# Patient Record
Sex: Female | Born: 1990 | Hispanic: No | Marital: Single | Smoking: Never smoker
Health system: Southern US, Community
[De-identification: ages and names within clinical notes are randomized; demographics above are authoritative.]

## PROBLEM LIST (undated history)

## (undated) ENCOUNTER — Emergency Department (HOSPITAL_COMMUNITY): Admission: EM | Payer: Self-pay | Source: Home / Self Care

## (undated) DIAGNOSIS — R87629 Unspecified abnormal cytological findings in specimens from vagina: Secondary | ICD-10-CM

## (undated) DIAGNOSIS — E119 Type 2 diabetes mellitus without complications: Secondary | ICD-10-CM

## (undated) DIAGNOSIS — O139 Gestational [pregnancy-induced] hypertension without significant proteinuria, unspecified trimester: Secondary | ICD-10-CM

## (undated) HISTORY — PX: BREAST SURGERY: SHX581

---

## 2013-07-14 DIAGNOSIS — O139 Gestational [pregnancy-induced] hypertension without significant proteinuria, unspecified trimester: Secondary | ICD-10-CM

## 2017-01-19 LAB — CYTOLOGY - PAP: PAP SMEAR: ABNORMAL — AB

## 2017-03-06 NOTE — L&D Delivery Note (Signed)
OB/GYN Faculty Practice Delivery Note  [name] is a 27 y.o. G2P1001 s/p SVD at 1540w0d. She was admitted for IOL for A1GDM.   ROM: 1h 2459m with clear fluid GBS Status: negative Maximum Maternal Temperature: Temp (24hrs), Avg:98.1 F (36.7 C), Min:98 F (36.7 C), Max:98.2 F (36.8 C)  Labor Progress: . Induction with cytotec and FB . Transitioned to pitocin . SROM clear fluid and near complete . Pushing involuntarily when 9cm dilated, no analgesia   Delivery Date/Time: 01/23/18 at 2006 Delivery: Called to room and patient was complete and pushing. Head delivered ROA with compound presentation of hand. No nuchal cord present - loose body cord. Shoulder and body delivered in usual fashion. Infant with spontaneous cry, placed on mother's abdomen, dried and stimulated. Cord clamped x 2 after 1-minute delay, and cut by provider. Cord blood drawn. Placenta delivered spontaneously with gentle cord traction. Fundus firm with massage and Pitocin. Labia, perineum, vagina, and cervix inspected inspected with 1st degree laceration and some cervical friability but without active bleeding.   Placenta: spontaneous, intact, 3-vessel cord Complications: pushing with 9cm cervix, occasional decelerations after AROM with pushing Lacerations: 1st degree repaired with 3-0 Vicryl and cervical friability without active bleeding EBL: 575cc  Postpartum Planning [x]  message to sent to schedule follow-up  [x]  vaccines UTD  Infant: vigorous female  APGARs 9, 9  weight pending  Arth Nicastro S. Earlene PlaterWallace, DO OB/GYN Fellow, Faculty Practice

## 2017-07-19 LAB — OB RESULTS CONSOLE RUBELLA ANTIBODY, IGM: Rubella: NON-IMMUNE/NOT IMMUNE

## 2017-07-19 LAB — SICKLE CELL SCREEN: Sickle Cell Screen: NEGATIVE

## 2017-07-19 LAB — OB RESULTS CONSOLE HIV ANTIBODY (ROUTINE TESTING): HIV: NONREACTIVE

## 2017-07-19 LAB — OB RESULTS CONSOLE ABO/RH: RH Type: POSITIVE

## 2017-07-19 LAB — OB RESULTS CONSOLE HGB/HCT, BLOOD
HEMATOCRIT: 36
Hemoglobin: 12.2

## 2017-07-19 LAB — OB RESULTS CONSOLE HEPATITIS B SURFACE ANTIGEN: Hepatitis B Surface Ag: NEGATIVE

## 2017-07-19 LAB — OB RESULTS CONSOLE RPR: RPR: NONREACTIVE

## 2017-07-19 LAB — OB RESULTS CONSOLE PLATELET COUNT: PLATELETS: 200

## 2017-07-19 LAB — CYSTIC FIBROSIS DIAGNOSTIC STUDY: INTERPRETATION-CFDNA: NEGATIVE

## 2017-07-19 LAB — OB RESULTS CONSOLE VARICELLA ZOSTER ANTIBODY, IGG: Varicella: IMMUNE

## 2017-07-19 LAB — OB RESULTS CONSOLE GC/CHLAMYDIA
CHLAMYDIA, DNA PROBE: NEGATIVE
GC PROBE AMP, GENITAL: NEGATIVE

## 2017-07-19 LAB — OB RESULTS CONSOLE ANTIBODY SCREEN: Antibody Screen: NEGATIVE

## 2017-08-07 ENCOUNTER — Encounter: Payer: Self-pay | Admitting: *Deleted

## 2017-08-09 ENCOUNTER — Ambulatory Visit (INDEPENDENT_AMBULATORY_CARE_PROVIDER_SITE_OTHER): Payer: Medicaid Other | Admitting: Obstetrics and Gynecology

## 2017-08-09 ENCOUNTER — Encounter: Payer: Self-pay | Admitting: Obstetrics and Gynecology

## 2017-08-09 DIAGNOSIS — O0992 Supervision of high risk pregnancy, unspecified, second trimester: Secondary | ICD-10-CM

## 2017-08-09 DIAGNOSIS — Z87898 Personal history of other specified conditions: Secondary | ICD-10-CM | POA: Insufficient documentation

## 2017-08-09 DIAGNOSIS — O0990 Supervision of high risk pregnancy, unspecified, unspecified trimester: Secondary | ICD-10-CM

## 2017-08-09 DIAGNOSIS — Z8759 Personal history of other complications of pregnancy, childbirth and the puerperium: Secondary | ICD-10-CM

## 2017-08-09 LAB — POCT URINALYSIS DIP (DEVICE)
Glucose, UA: NEGATIVE mg/dL
Hgb urine dipstick: NEGATIVE
Nitrite: NEGATIVE
PROTEIN: 30 mg/dL — AB
SPECIFIC GRAVITY, URINE: 1.025 (ref 1.005–1.030)
UROBILINOGEN UA: 0.2 mg/dL (ref 0.0–1.0)
pH: 5.5 (ref 5.0–8.0)

## 2017-08-09 LAB — GLUCOSE TOLERANCE, 1 HOUR: GLUCOSE 1 HOUR GTT: 85

## 2017-08-09 MED ORDER — ASPIRIN EC 81 MG PO TBEC: 81.0000 mg | DELAYED_RELEASE_TABLET | Freq: Every day | ORAL | 2 refills | Status: DC

## 2017-08-09 NOTE — Addendum Note (Signed)
Addended by: Kathee DeltonHILLMAN, Shedrick Sarli L on: 08/09/2017 02:36 PM   Modules accepted: Orders

## 2017-08-09 NOTE — Progress Notes (Signed)
Subjective:  Kerry Turner is a 27 y.o. G1P0 at 5972w1d being seen today for ongoing prenatal care. [facility] with IOL at 38 weeks. Also LBW of 5 # 7 oz.   She is currently monitored for the following issues for this high-risk pregnancy and has Supervision of high risk pregnancy, antepartum; History of low birth weight; and History of gestational hypertension on their problem list.  Patient reports headache.  Contractions: Not present. Vag. Bleeding: None.  Movement: Absent. Denies leaking of fluid.   The following portions of the patient's history were reviewed and updated as appropriate: allergies, current medications, past family history, past medical history, past social history, past surgical history and problem list. Problem list updated.  Objective:   Vitals:   08/09/17 1339 08/09/17 1339  BP: 107/63   Pulse: 78   Weight: 129 lb (58.5 kg)   Height:  4\' 11"  (1.499 m)    Fetal Status: Fetal Heart Rate (bpm): 154   Movement: Absent     General:  Alert, oriented and cooperative. Patient is in no acute distress.  Skin: Skin is warm and dry. No rash noted.   Cardiovascular: Normal heart rate noted  Respiratory: Normal respiratory effort, no problems with respiration noted  Abdomen: Soft, gravid, appropriate for gestational age. Pain/Pressure: Present     Pelvic:  Cervical exam deferred        Extremities: Normal range of motion.  Edema: None  Mental Status: Normal mood and affect. Normal behavior. Normal judgment and thought content.   Urinalysis: Urine Protein: 1+ Urine Glucose: Negative  Assessment and Plan:  Pregnancy: G1P0 at 6672w1d  1. Supervision of high risk pregnancy, antepartum Prenatal care reviewed with pt - US MFM OB DETAIL +14 WK; Future  2. History of low birth weight Appears to be related to # 3 - US MFM OB DETAIL +14 WK; Future  3. History of gestational hypertension Will have pt sign for release of medical records of  2105 delivery Discussed PEC and pregnancy Will start BASA. Discussed indications for BASA Will check additional labs today as well.  Interrupter services used for today's visit  Preterm labor symptoms and general obstetric precautions including but not limited to vaginal bleeding, contractions, leaking of fluid and fetal movement were reviewed in detail with the patient. Please refer to After Visit Summary for other counseling recommendations.  Return in about 1 month (around 09/06/2017) for OB visit.   Hermina StaggersErvin, Michael L, MD

## 2017-08-09 NOTE — Patient Instructions (Signed)

## 2017-08-10 LAB — COMPREHENSIVE METABOLIC PANEL
A/G RATIO: 1.3 (ref 1.2–2.2)
ALBUMIN: 3.6 g/dL (ref 3.5–5.5)
ALK PHOS: 60 IU/L (ref 39–117)
ALT: 13 IU/L (ref 0–32)
AST: 12 IU/L (ref 0–40)
BILIRUBIN TOTAL: 0.3 mg/dL (ref 0.0–1.2)
BUN / CREAT RATIO: 13 (ref 9–23)
BUN: 4 mg/dL — AB (ref 6–20)
CHLORIDE: 106 mmol/L (ref 96–106)
CO2: 21 mmol/L (ref 20–29)
Calcium: 9.3 mg/dL (ref 8.7–10.2)
Creatinine, Ser: 0.31 mg/dL — ABNORMAL LOW (ref 0.57–1.00)
GFR calc non Af Amer: 156 mL/min/{1.73_m2} (ref >59)
GFR, EST AFRICAN AMERICAN: 180 mL/min/{1.73_m2} (ref >59)
GLUCOSE: 93 mg/dL (ref 65–99)
Globulin, Total: 2.7 g/dL (ref 1.5–4.5)
POTASSIUM: 3.6 mmol/L (ref 3.5–5.2)
Sodium: 139 mmol/L (ref 134–144)
TOTAL PROTEIN: 6.3 g/dL (ref 6.0–8.5)

## 2017-08-10 LAB — PROTEIN / CREATININE RATIO, URINE
CREATININE, UR: 178.3 mg/dL
Protein, Ur: 35.8 mg/dL
Protein/Creat Ratio: 201 mg/g creat — ABNORMAL HIGH (ref 0–200)

## 2017-08-10 LAB — TSH: TSH: 0.685 u[IU]/mL (ref 0.450–4.500)

## 2017-08-16 ENCOUNTER — Encounter: Payer: Self-pay | Admitting: *Deleted

## 2017-08-21 ENCOUNTER — Encounter (HOSPITAL_COMMUNITY): Payer: Self-pay

## 2017-08-29 ENCOUNTER — Encounter (HOSPITAL_COMMUNITY): Payer: Self-pay

## 2017-08-29 ENCOUNTER — Other Ambulatory Visit: Payer: Self-pay | Admitting: Obstetrics and Gynecology

## 2017-08-29 ENCOUNTER — Ambulatory Visit (HOSPITAL_COMMUNITY)
Admission: RE | Admit: 2017-08-29 | Discharge: 2017-08-29 | Disposition: A | Discharge status: Home / Self Care | Payer: Medicaid Other | Source: Ambulatory Visit | Attending: Obstetrics and Gynecology | Admitting: Obstetrics and Gynecology

## 2017-08-29 DIAGNOSIS — Z3A19 19 weeks gestation of pregnancy: Secondary | ICD-10-CM

## 2017-08-29 DIAGNOSIS — O321XX0 Maternal care for breech presentation, not applicable or unspecified: Secondary | ICD-10-CM | POA: Insufficient documentation

## 2017-08-29 DIAGNOSIS — O09292 Supervision of pregnancy with other poor reproductive or obstetric history, second trimester: Secondary | ICD-10-CM | POA: Insufficient documentation

## 2017-08-29 DIAGNOSIS — O0990 Supervision of high risk pregnancy, unspecified, unspecified trimester: Secondary | ICD-10-CM

## 2017-08-29 DIAGNOSIS — Z87898 Personal history of other specified conditions: Secondary | ICD-10-CM

## 2017-08-29 DIAGNOSIS — Z363 Encounter for antenatal screening for malformations: Secondary | ICD-10-CM

## 2017-08-29 DIAGNOSIS — Z8759 Personal history of other complications of pregnancy, childbirth and the puerperium: Secondary | ICD-10-CM

## 2017-08-29 DIAGNOSIS — O09299 Supervision of pregnancy with other poor reproductive or obstetric history, unspecified trimester: Secondary | ICD-10-CM

## 2017-08-29 HISTORY — DX: Gestational (pregnancy-induced) hypertension without significant proteinuria, unspecified trimester: O13.9

## 2017-08-30 ENCOUNTER — Other Ambulatory Visit (HOSPITAL_COMMUNITY): Payer: Self-pay | Admitting: *Deleted

## 2017-08-30 DIAGNOSIS — O09299 Supervision of pregnancy with other poor reproductive or obstetric history, unspecified trimester: Secondary | ICD-10-CM

## 2017-09-07 ENCOUNTER — Encounter: Payer: Medicaid Other | Admitting: Obstetrics & Gynecology

## 2017-09-07 ENCOUNTER — Telehealth: Payer: Self-pay | Admitting: Obstetrics & Gynecology

## 2017-09-07 NOTE — Telephone Encounter (Signed)
Called patient with Interpreter to reschedule appointment.

## 2017-09-19 ENCOUNTER — Ambulatory Visit (INDEPENDENT_AMBULATORY_CARE_PROVIDER_SITE_OTHER): Payer: Medicaid Other | Admitting: Obstetrics and Gynecology

## 2017-09-19 VITALS — BP 99/66 | HR 78 | Wt 135.0 lb

## 2017-09-19 DIAGNOSIS — Z8759 Personal history of other complications of pregnancy, childbirth and the puerperium: Secondary | ICD-10-CM

## 2017-09-19 DIAGNOSIS — Z87898 Personal history of other specified conditions: Secondary | ICD-10-CM

## 2017-09-19 DIAGNOSIS — O0990 Supervision of high risk pregnancy, unspecified, unspecified trimester: Secondary | ICD-10-CM

## 2017-09-19 DIAGNOSIS — Z789 Other specified health status: Secondary | ICD-10-CM

## 2017-09-19 DIAGNOSIS — Z758 Other problems related to medical facilities and other health care: Secondary | ICD-10-CM

## 2017-09-19 MED ORDER — PREPLUS 27-1 MG PO TABS: 1.0000 | ORAL_TABLET | Freq: Every day | ORAL | 3 refills | Status: DC

## 2017-09-19 MED ORDER — ASPIRIN EC 81 MG PO TBEC: 81.0000 mg | DELAYED_RELEASE_TABLET | Freq: Every day | ORAL | 2 refills | Status: DC

## 2017-09-19 NOTE — Progress Notes (Signed)
Prenatal Visit Note Date: 09/19/2017 Clinic: [facility]  Subjective:  Kerry Turner is a 27 y.o. G2P1001 at 87102w0d being seen today for ongoing prenatal care.  She is currently monitored for the following issues for this high-risk pregnancy and has Supervision of high risk pregnancy, antepartum; History of low birth weight; History of gestational hypertension; Pregnancy with poor obstetric history; and Language barrier on their problem list.  Patient reports no complaints.   Contractions: Not present. Vag. Bleeding: None.  Movement: Absent. Denies leaking of fluid.   The following portions of the patient's history were reviewed and updated as appropriate: allergies, current medications, past family history, past medical history, past social history, past surgical history and problem list. Problem list updated.  Objective:   Vitals:   09/19/17 1103  BP: 99/66  Pulse: 78  Weight: 135 lb (61.2 kg)    Fetal Status: Fetal Heart Rate (bpm): 156   Movement: Absent     General:  Alert, oriented and cooperative. Patient is in no acute distress.  Skin: Skin is warm and dry. No rash noted.   Cardiovascular: Normal heart rate noted  Respiratory: Normal respiratory effort, no problems with respiration noted  Abdomen: Soft, gravid, appropriate for gestational age. Pain/Pressure: Absent     Pelvic:  Cervical exam deferred        Extremities: Normal range of motion.  Edema: None  Mental Status: Normal mood and affect. Normal behavior. Normal judgment and thought content.   Urinalysis:      Assessment and Plan:  Pregnancy: G2P1001 at 57102w0d  1. Supervision of high risk pregnancy, antepartum Routine care  2. History of low birth weight Has serial u/s already scheduled  3. History of gestational hypertension D/w her re: rationale and pt sounds amenable to taking - aspirin EC 81 MG tablet; Take 1 tablet (81 mg total) by mouth daily. Take after 12 weeks for prevention of  preeclampsia later in pregnancy  Dispense: 60 tablet; Refill: 2  4. Language barrier Interpreter used  Preterm labor symptoms and general obstetric precautions including but not limited to vaginal bleeding, contractions, leaking of fluid and fetal movement were reviewed in detail with the patient. Please refer to After Visit Summary for other counseling recommendations.  Return in about 3 weeks (around 10/10/2017) for hrob.   Cheney BingPickens, Kween Bacorn, MD

## 2017-10-10 ENCOUNTER — Ambulatory Visit (INDEPENDENT_AMBULATORY_CARE_PROVIDER_SITE_OTHER): Payer: Medicaid Other | Admitting: Obstetrics and Gynecology

## 2017-10-10 ENCOUNTER — Encounter (HOSPITAL_COMMUNITY): Payer: Self-pay

## 2017-10-10 ENCOUNTER — Ambulatory Visit (HOSPITAL_COMMUNITY)
Admission: RE | Admit: 2017-10-10 | Discharge: 2017-10-10 | Disposition: A | Discharge status: Home / Self Care | Payer: Medicaid Other | Source: Ambulatory Visit | Attending: Obstetrics and Gynecology | Admitting: Obstetrics and Gynecology

## 2017-10-10 VITALS — BP 107/73 | HR 96 | Wt 143.0 lb

## 2017-10-10 DIAGNOSIS — Z3A25 25 weeks gestation of pregnancy: Secondary | ICD-10-CM | POA: Diagnosis not present

## 2017-10-10 DIAGNOSIS — Z362 Encounter for other antenatal screening follow-up: Secondary | ICD-10-CM

## 2017-10-10 DIAGNOSIS — Z8759 Personal history of other complications of pregnancy, childbirth and the puerperium: Secondary | ICD-10-CM

## 2017-10-10 DIAGNOSIS — O09292 Supervision of pregnancy with other poor reproductive or obstetric history, second trimester: Secondary | ICD-10-CM

## 2017-10-10 DIAGNOSIS — Z87898 Personal history of other specified conditions: Secondary | ICD-10-CM

## 2017-10-10 DIAGNOSIS — O09299 Supervision of pregnancy with other poor reproductive or obstetric history, unspecified trimester: Secondary | ICD-10-CM

## 2017-10-10 DIAGNOSIS — Z029 Encounter for administrative examinations, unspecified: Secondary | ICD-10-CM

## 2017-10-10 DIAGNOSIS — O0990 Supervision of high risk pregnancy, unspecified, unspecified trimester: Secondary | ICD-10-CM

## 2017-10-10 DIAGNOSIS — Z789 Other specified health status: Secondary | ICD-10-CM

## 2017-10-10 LAB — POCT URINALYSIS DIP (DEVICE)
BILIRUBIN URINE: NEGATIVE
Glucose, UA: NEGATIVE mg/dL
Hgb urine dipstick: NEGATIVE
Ketones, ur: NEGATIVE mg/dL
NITRITE: NEGATIVE
PH: 7.5 (ref 5.0–8.0)
PROTEIN: 100 mg/dL — AB
Specific Gravity, Urine: 1.02 (ref 1.005–1.030)
UROBILINOGEN UA: 0.2 mg/dL (ref 0.0–1.0)

## 2017-10-10 MED ORDER — HYDROCORTISONE 1 % EX OINT: 1.0000 "application " | TOPICAL_OINTMENT | Freq: Two times a day (BID) | CUTANEOUS | 0 refills | Status: DC | PRN

## 2017-10-10 NOTE — Progress Notes (Signed)
Prenatal Visit Note Date: 10/10/2017 Clinic: [facility]  Subjective:  Kerry Turner is a 27 y.o. G2P1001 at 1537w0d being seen today for ongoing prenatal care.  She is currently monitored for the following issues for this high-risk pregnancy and has Supervision of high risk pregnancy, antepartum; History of low birth weight; History of gestational hypertension; Pregnancy with poor obstetric history; and Language barrier on their problem list.  Patient reports right hand and interdigit itching x 2wks. no help with lotions. no prior s/s.Marland Kitchen.   Contractions: Not present. Vag. Bleeding: None.  Movement: Present. Denies leaking of fluid.   The following portions of the patient's history were reviewed and updated as appropriate: allergies, current medications, past family history, past medical history, past social history, past surgical history and problem list. Problem list updated.  Objective:   Vitals:   10/10/17 1355  BP: 107/73  Pulse: 96  Weight: 143 lb (64.9 kg)    Fetal Status: Fetal Heart Rate (bpm): 157   Movement: Present     General:  Alert, oriented and cooperative. Patient is in no acute distress.  Skin: Skin is warm and dry. No rash noted.   Cardiovascular: Normal heart rate noted  Respiratory: Normal respiratory effort, no problems with respiration noted  Abdomen: Soft, gravid, appropriate for gestational age. Pain/Pressure: Absent     Pelvic:  Cervical exam deferred        Extremities: Normal range of motion.  Edema: None  Mental Status: Normal mood and affect. Normal behavior. Normal judgment and thought content.   Urinalysis:      Assessment and Plan:  Pregnancy: G2P1001 at 3637w0d  1. Supervision of high risk pregnancy, antepartum Routine care. 28wk labs nv. Bid prn hydrocortisone ointment sent in  2. History of gestational hypertension Continue low dose ASA  3. History of low birth weight Has serial growth u/s scheduled. F/u the 3pm one  today  4. Language barrier Interpreter used  Preterm labor symptoms and general obstetric precautions including but not limited to vaginal bleeding, contractions, leaking of fluid and fetal movement were reviewed in detail with the patient. Please refer to After Visit Summary for other counseling recommendations.  Return in about 2 weeks (around 10/24/2017) for 2-3wk hrob and 2h GTT.   Rainbow BingPickens, Ainslie Mazurek, MD

## 2017-10-10 NOTE — Progress Notes (Signed)
[employer] Psychologist, forensicDigital Interpreter [employer] id# [phone]

## 2017-10-31 ENCOUNTER — Other Ambulatory Visit: Payer: Self-pay

## 2017-10-31 DIAGNOSIS — O0990 Supervision of high risk pregnancy, unspecified, unspecified trimester: Secondary | ICD-10-CM

## 2017-11-02 ENCOUNTER — Other Ambulatory Visit: Payer: Medicaid Other

## 2017-11-02 ENCOUNTER — Ambulatory Visit (INDEPENDENT_AMBULATORY_CARE_PROVIDER_SITE_OTHER): Payer: Medicaid Other | Admitting: Obstetrics and Gynecology

## 2017-11-02 ENCOUNTER — Encounter: Payer: Self-pay | Admitting: Obstetrics and Gynecology

## 2017-11-02 DIAGNOSIS — Z23 Encounter for immunization: Secondary | ICD-10-CM

## 2017-11-02 DIAGNOSIS — O0990 Supervision of high risk pregnancy, unspecified, unspecified trimester: Secondary | ICD-10-CM | POA: Diagnosis present

## 2017-11-02 NOTE — Patient Instructions (Signed)
Third Trimester of Pregnancy The third trimester is from week 28 through week 40 (months 7 through 9). The third trimester is a time when the unborn baby (fetus) is growing rapidly. At the end of the ninth month, the fetus is about 20 inches in length and weighs 6-10 pounds. Body changes during your third trimester Your body will continue to go through many changes during pregnancy. The changes vary from woman to woman. During the third trimester:  Your weight will continue to increase. You can expect to gain 25-35 pounds (11-16 kg) by the end of the pregnancy.  You may begin to get stretch marks on your hips, abdomen, and breasts.  You may urinate more often because the fetus is moving lower into your pelvis and pressing on your bladder.  You may develop or continue to have heartburn. This is caused by increased hormones that slow down muscles in the digestive tract.  You may develop or continue to have constipation because increased hormones slow digestion and cause the muscles that push waste through your intestines to relax.  You may develop hemorrhoids. These are swollen veins (varicose veins) in the rectum that can itch or be painful.  You may develop swollen, bulging veins (varicose veins) in your legs.  You may have increased body aches in the pelvis, back, or thighs. This is due to weight gain and increased hormones that are relaxing your joints.  You may have changes in your hair. These can include thickening of your hair, rapid growth, and changes in texture. Some women also have hair loss during or after pregnancy, or hair that feels dry or thin. Your hair will most likely return to normal after your baby is born.  Your breasts will continue to grow and they will continue to become tender. A yellow fluid (colostrum) may leak from your breasts. This is the first milk you are producing for your baby.  Your belly button may stick out.  You may notice more swelling in your hands,  face, or ankles.  You may have increased tingling or numbness in your hands, arms, and legs. The skin on your belly may also feel numb.  You may feel short of breath because of your expanding uterus.  You may have more problems sleeping. This can be caused by the size of your belly, increased need to urinate, and an increase in your body's metabolism.  You may notice the fetus "dropping," or moving lower in your abdomen (lightening).  You may have increased vaginal discharge.  You may notice your joints feel loose and you may have pain around your pelvic bone.  What to expect at prenatal visits You will have prenatal exams every 2 weeks until week 36. Then you will have weekly prenatal exams. During a routine prenatal visit:  You will be weighed to make sure you and the baby are growing normally.  Your blood pressure will be taken.  Your abdomen will be measured to track your baby's growth.  The fetal heartbeat will be listened to.  Any test results from the previous visit will be discussed.  You may have a cervical check near your due date to see if your cervix has softened or thinned (effaced).  You will be tested for Group B streptococcus. This happens between 35 and 37 weeks.  Your health care provider may ask you:  What your birth plan is.  How you are feeling.  If you are feeling the baby move.  If you have had   any abnormal symptoms, such as leaking fluid, bleeding, severe headaches, or abdominal cramping.  If you are using any tobacco products, including cigarettes, chewing tobacco, and electronic cigarettes.  If you have any questions.  Other tests or screenings that may be performed during your third trimester include:  Blood tests that check for low iron levels (anemia).  Fetal testing to check the health, activity level, and growth of the fetus. Testing is done if you have certain medical conditions or if there are problems during the  pregnancy.  Nonstress test (NST). This test checks the health of your baby to make sure there are no signs of problems, such as the baby not getting enough oxygen. During this test, a belt is placed around your belly. The baby is made to move, and its heart rate is monitored during movement.  What is false labor? False labor is a condition in which you feel small, irregular tightenings of the muscles in the womb (contractions) that usually go away with rest, changing position, or drinking water. These are called Braxton Hicks contractions. Contractions may last for hours, days, or even weeks before true labor sets in. If contractions come at regular intervals, become more frequent, increase in intensity, or become painful, you should see your health care provider. What are the signs of labor?  Abdominal cramps.  Regular contractions that start at 10 minutes apart and become stronger and more frequent with time.  Contractions that start on the top of the uterus and spread down to the lower abdomen and back.  Increased pelvic pressure and dull back pain.  A watery or bloody mucus discharge that comes from the vagina.  Leaking of amniotic fluid. This is also known as your "water breaking." It could be a slow trickle or a gush. Let your health care provider know if it has a color or strange odor. If you have any of these signs, call your health care provider right away, even if it is before your due date. Follow these instructions at home: Medicines  Follow your health care provider's instructions regarding medicine use. Specific medicines may be either safe or unsafe to take during pregnancy.  Take a prenatal vitamin that contains at least 600 micrograms (mcg) of folic acid.  If you develop constipation, try taking a stool softener if your health care provider approves. Eating and drinking  Eat a balanced diet that includes fresh fruits and vegetables, whole grains, good sources of protein  such as meat, eggs, or tofu, and low-fat dairy. Your health care provider will help you determine the amount of weight gain that is right for you.  Avoid raw meat and uncooked cheese. These carry germs that can cause birth defects in the baby.  If you have low calcium intake from food, talk to your health care provider about whether you should take a daily calcium supplement.  Eat four or five small meals rather than three large meals a day.  Limit foods that are high in fat and processed sugars, such as fried and sweet foods.  To prevent constipation: ? Drink enough fluid to keep your urine clear or pale yellow. ? Eat foods that are high in fiber, such as fresh fruits and vegetables, whole grains, and beans. Activity  Exercise only as directed by your health care provider. Most women can continue their usual exercise routine during pregnancy. Try to exercise for 30 minutes at least 5 days a week. Stop exercising if you experience uterine contractions.  Avoid heavy   lifting.  Do not exercise in extreme heat or humidity, or at high altitudes.  Wear low-heel, comfortable shoes.  Practice good posture.  You may continue to have sex unless your health care provider tells you otherwise. Relieving pain and discomfort  Take frequent breaks and rest with your legs elevated if you have leg cramps or low back pain.  Take warm sitz baths to soothe any pain or discomfort caused by hemorrhoids. Use hemorrhoid cream if your health care provider approves.  Wear a good support bra to prevent discomfort from breast tenderness.  If you develop varicose veins: ? Wear support pantyhose or compression stockings as told by your healthcare provider. ? Elevate your feet for 15 minutes, 3-4 times a day. Prenatal care  Write down your questions. Take them to your prenatal visits.  Keep all your prenatal visits as told by your health care provider. This is important. Safety  Wear your seat belt at  all times when driving.  Make a list of emergency phone numbers, including numbers for family, friends, the hospital, and police and fire departments. General instructions  Avoid cat litter boxes and soil used by cats. These carry germs that can cause birth defects in the baby. If you have a cat, ask someone to clean the litter box for you.  Do not travel far distances unless it is absolutely necessary and only with the approval of your health care provider.  Do not use hot tubs, steam rooms, or saunas.  Do not drink alcohol.  Do not use any products that contain nicotine or tobacco, such as cigarettes and e-cigarettes. If you need help quitting, ask your health care provider.  Do not use any medicinal herbs or unprescribed drugs. These chemicals affect the formation and growth of the baby.  Do not douche or use tampons or scented sanitary pads.  Do not cross your legs for long periods of time.  To prepare for the arrival of your baby: ? Take prenatal classes to understand, practice, and ask questions about labor and delivery. ? Make a trial run to the hospital. ? Visit the hospital and tour the maternity area. ? Arrange for maternity or paternity leave through employers. ? Arrange for family and friends to take care of pets while you are in the hospital. ? Purchase a rear-facing car seat and make sure you know how to install it in your car. ? Pack your hospital bag. ? Prepare the baby's nursery. Make sure to remove all pillows and stuffed animals from the baby's crib to prevent suffocation.  Visit your dentist if you have not gone during your pregnancy. Use a soft toothbrush to brush your teeth and be gentle when you floss. Contact a health care provider if:  You are unsure if you are in labor or if your water has broken.  You become dizzy.  You have mild pelvic cramps, pelvic pressure, or nagging pain in your abdominal area.  You have lower back pain.  You have persistent  nausea, vomiting, or diarrhea.  You have an unusual or bad smelling vaginal discharge.  You have pain when you urinate. Get help right away if:  Your water breaks before 37 weeks.  You have regular contractions less than 5 minutes apart before 37 weeks.  You have a fever.  You are leaking fluid from your vagina.  You have spotting or bleeding from your vagina.  You have severe abdominal pain or cramping.  You have rapid weight loss or weight gain.    You have shortness of breath with chest pain.  You notice sudden or extreme swelling of your face, hands, ankles, feet, or legs.  Your baby makes fewer than 10 movements in 2 hours.  You have severe headaches that do not go away when you take medicine.  You have vision changes. Summary  The third trimester is from week 28 through week 40, months 7 through 9. The third trimester is a time when the unborn baby (fetus) is growing rapidly.  During the third trimester, your discomfort may increase as you and your baby continue to gain weight. You may have abdominal, leg, and back pain, sleeping problems, and an increased need to urinate.  During the third trimester your breasts will keep growing and they will continue to become tender. A yellow fluid (colostrum) may leak from your breasts. This is the first milk you are producing for your baby.  False labor is a condition in which you feel small, irregular tightenings of the muscles in the womb (contractions) that eventually go away. These are called Braxton Hicks contractions. Contractions may last for hours, days, or even weeks before true labor sets in.  Signs of labor can include: abdominal cramps; regular contractions that start at 10 minutes apart and become stronger and more frequent with time; watery or bloody mucus discharge that comes from the vagina; increased pelvic pressure and dull back pain; and leaking of amniotic fluid. This information is not intended to replace advice  given to you by your health care provider. Make sure you discuss any questions you have with your health care provider. Document Released: 02/14/2001 Document Revised: 07/29/2015 Document Reviewed: 04/23/2012 Elsevier Interactive Patient Education  2017 [employer]  

## 2017-11-02 NOTE — Progress Notes (Signed)
Subjective:  Kerry Turner is a 27 y.o. G2P1001 at 3519w2d being seen today for ongoing prenatal care.  She is currently monitored for the following issues for this high-risk pregnancy and has Supervision of high risk pregnancy, antepartum; History of low birth weight; History of gestational hypertension; Pregnancy with poor obstetric history; and Language barrier on their problem list.  History obtained via [place] interpreter, who was present throughout visit. Patient reports general dicomforts of pregnancy, specifically right leg pain.  Contractions: Not present. Vag. Bleeding: None.  Movement: Present. Denies leaking of fluid.   The following portions of the patient's history were reviewed and updated as appropriate: allergies, current medications, past family history, past medical history, past social history, past surgical history and problem list. Problem list updated.  Objective:   Vitals:   11/02/17 0949  BP: 107/68  Pulse: 92  Weight: 144 lb 8 oz (65.5 kg)    Fetal Status: Fetal Heart Rate (bpm): 156   Movement: Present     General:  Alert, oriented and cooperative. Patient is in no acute distress.  Skin: Skin is warm and dry. No rash noted.   Cardiovascular: Normal heart rate noted  Respiratory: Normal respiratory effort, no problems with respiration noted  Abdomen: Soft, gravid, appropriate for gestational age. Pain/Pressure: Present     Pelvic:  Cervical exam deferred        Extremities: Normal range of motion.  Edema: None  Mental Status: Normal mood and affect. Normal behavior. Normal judgment and thought content.   Urinalysis:      Assessment and Plan:  Pregnancy: G2P1001 at 4119w2d  1. Supervision of high risk pregnancy, antepartum - GTT today - Continue routine prenatal care  2. History of low birth weight - U/s 10/10/17 fetal growth appropriate for gestational age - F/u fetal growth scan scheduled for 11/22/17  3. History of gestational hypertension - Continue low  dose ASA   4. Language Barrier - Interpreter present throughout visit  Preterm labor symptoms and general obstetric precautions including but not limited to vaginal bleeding, contractions, leaking of fluid and fetal movement were reviewed in detail with the patient. Please refer to After Visit Summary for other counseling recommendations.  No follow-ups on file.   Natasha Meadhadwick, Cristobal Advani A, Medical Student

## 2017-11-03 LAB — CBC
HEMATOCRIT: 35.6 % (ref 34.0–46.6)
Hemoglobin: 11.9 g/dL (ref 11.1–15.9)
MCH: 31.2 pg (ref 26.6–33.0)
MCHC: 33.4 g/dL (ref 31.5–35.7)
MCV: 93 fL (ref 79–97)
Platelets: 229 10*3/uL (ref 150–450)
RBC: 3.81 x10E6/uL (ref 3.77–5.28)
RDW: 11.9 % — AB (ref 12.3–15.4)
WBC: 11.6 10*3/uL — ABNORMAL HIGH (ref 3.4–10.8)

## 2017-11-03 LAB — RPR: RPR Ser Ql: NONREACTIVE

## 2017-11-03 LAB — GLUCOSE TOLERANCE, 2 HOURS W/ 1HR
Glucose, 1 hour: 183 mg/dL — ABNORMAL HIGH (ref 65–179)
Glucose, 2 hour: 120 mg/dL (ref 65–152)
Glucose, Fasting: 104 mg/dL — ABNORMAL HIGH (ref 65–91)

## 2017-11-03 LAB — HIV ANTIBODY (ROUTINE TESTING W REFLEX): HIV Screen 4th Generation wRfx: NONREACTIVE

## 2017-11-06 ENCOUNTER — Encounter: Payer: Self-pay | Admitting: Obstetrics and Gynecology

## 2017-11-06 DIAGNOSIS — O24419 Gestational diabetes mellitus in pregnancy, unspecified control: Secondary | ICD-10-CM | POA: Insufficient documentation

## 2017-11-12 ENCOUNTER — Telehealth: Payer: Self-pay | Admitting: General Practice

## 2017-11-12 DIAGNOSIS — O24410 Gestational diabetes mellitus in pregnancy, diet controlled: Secondary | ICD-10-CM

## 2017-11-12 MED ORDER — GLUCOSE BLOOD VI STRP: ORAL_STRIP | 12 refills | Status: DC

## 2017-11-12 MED ORDER — ACCU-CHEK FASTCLIX LANCETS MISC: 1.0000 | Freq: Four times a day (QID) | 12 refills | Status: DC

## 2017-11-12 MED ORDER — ACCU-CHEK GUIDE W/DEVICE KIT: 1.0000 | PACK | Freq: Once | 0 refills | Status: AC

## 2017-11-12 NOTE — Telephone Encounter (Signed)
Called patient with pacific interpreter [phone] and informed her of results & testing supplies sent to pharmacy. Informed patient of appt tomorrow. Patient verbalized understanding to all & had no questions.

## 2017-11-12 NOTE — Telephone Encounter (Signed)
-----   Message from Landrum Bing, MD sent at 11/11/2017  3:23 PM EDT ----- I sent an inbasket message about this for her last week but can she be set up for DM education and teaching ASAP? thanks

## 2017-11-13 ENCOUNTER — Ambulatory Visit: Payer: Medicaid Other | Admitting: *Deleted

## 2017-11-13 ENCOUNTER — Other Ambulatory Visit: Payer: Medicaid Other

## 2017-11-13 ENCOUNTER — Encounter: Payer: Medicaid Other | Attending: Obstetrics and Gynecology | Admitting: *Deleted

## 2017-11-13 DIAGNOSIS — O24410 Gestational diabetes mellitus in pregnancy, diet controlled: Secondary | ICD-10-CM

## 2017-11-13 DIAGNOSIS — Z713 Dietary counseling and surveillance: Secondary | ICD-10-CM | POA: Insufficient documentation

## 2017-11-13 DIAGNOSIS — Z3A00 Weeks of gestation of pregnancy not specified: Secondary | ICD-10-CM | POA: Diagnosis not present

## 2017-11-13 NOTE — Progress Notes (Signed)
  Patient was seen on 11/13/2017 for Gestational Diabetes self-management. Patient speaks [place]ali, we used [place] today. She states she can read some [place] but has difficulty speaking it. EDD 01/23/2018. Patient states no history of GDM. Diet history obtained. Patient eats good variety of all food groups but meat is limited to 1-2 times a week due to her culture. Beverages include hot tea with small amount of milk and water.  She often skips breakfast as she is getting her child and their cousins ready for school, then goes to bed until 10 AM. Her first meal is around 11 AM. The following learning objectives were met by the patient :   States the definition of Gestational Diabetes  States why dietary management is important in controlling blood glucose  Describes the effects of carbohydrates on blood glucose levels  Demonstrates ability to create a balanced meal plan  Demonstrates carbohydrate counting   States when to check blood glucose levels  Demonstrates proper blood glucose monitoring techniques  States the effect of stress and exercise on blood glucose levels  States the importance of limiting caffeine and abstaining from alcohol and smoking  Plan:  Aim for 3 Carb Choices per meal (45 grams) +/- 1 either way  Aim for 1-2 Carbs per snack Begin reading food labels for Total Carbohydrate of foods If OK with your MD, consider  increasing your activity level by walking, Arm Chair Exercises or other activity daily as tolerated Begin checking BG before breakfast and 2 hours after first bite of breakfast, lunch and dinner as directed by MD  Bring Log Book/Sheet to every medical appointment   Baby Scripts:  Patient not appropriate for Baby Scripts due to language barrier  Take medication if directed by MD  Patient already has a meter: [employer] but needs instruction Patient instructed to test pre breakfast and 2 hours each meal as directed by MD BG today  was 86 mg/dl Fasting.  Patient instructed to monitor glucose levels: FBS: 60 - 95 mg/dl 2 hour: <120 mg/dl  Patient received the following handouts:in English per patient request  Nutrition Diabetes and Pregnancy  Carbohydrate Counting List  BG Log Sheet  Patient will be seen for follow-up as needed.

## 2017-11-14 ENCOUNTER — Ambulatory Visit (INDEPENDENT_AMBULATORY_CARE_PROVIDER_SITE_OTHER): Payer: Medicaid Other | Admitting: Obstetrics and Gynecology

## 2017-11-14 VITALS — BP 108/70 | HR 110 | Wt 146.1 lb

## 2017-11-14 DIAGNOSIS — O0993 Supervision of high risk pregnancy, unspecified, third trimester: Secondary | ICD-10-CM

## 2017-11-14 DIAGNOSIS — O0990 Supervision of high risk pregnancy, unspecified, unspecified trimester: Secondary | ICD-10-CM

## 2017-11-14 DIAGNOSIS — O24419 Gestational diabetes mellitus in pregnancy, unspecified control: Secondary | ICD-10-CM

## 2017-11-14 DIAGNOSIS — Z87898 Personal history of other specified conditions: Secondary | ICD-10-CM

## 2017-11-14 DIAGNOSIS — Z23 Encounter for immunization: Secondary | ICD-10-CM

## 2017-11-14 DIAGNOSIS — Z8759 Personal history of other complications of pregnancy, childbirth and the puerperium: Secondary | ICD-10-CM

## 2017-11-14 NOTE — Progress Notes (Signed)
Pt states having neck pain, started 4 days ago, also having pain in her eyes. Pt states also having headaches.Has not been taking any pain medicines.

## 2017-11-14 NOTE — Progress Notes (Signed)
Prenatal Visit Note Date: 11/14/2017 Clinic: [facility]  Subjective:  Kerry Turner is a 27 y.o. G2P1001 at [redacted]w[redacted]d being seen today for ongoing prenatal care.  She is currently monitored for the following issues for this high-risk pregnancy and has Supervision of high risk pregnancy, antepartum; History of low birth weight; History of gestational hypertension; Pregnancy with poor obstetric history; Language barrier; and GDM (gestational diabetes mellitus) on their problem list.  Patient reports see RN note. .   Contractions: Irritability. Vag. Bleeding: None.  Movement: Present. Denies leaking of fluid.   The following portions of the patient's history were reviewed and updated as appropriate: allergies, current medications, past family history, past medical history, past social history, past surgical history and problem list. Problem list updated.  Objective:   Vitals:   11/14/17 1407  BP: 108/70  Pulse: (!) 110  Weight: 146 lb 1.6 oz (66.3 kg)    Fetal Status: Fetal Heart Rate (bpm): 160   Movement: Present     General:  Alert, oriented and cooperative. Patient is in no acute distress.  Skin: Skin is warm and dry. No rash noted.   Cardiovascular: Normal heart rate noted  Respiratory: Normal respiratory effort, no problems with respiration noted  Abdomen: Soft, gravid, appropriate for gestational age. Pain/Pressure: Absent     Pelvic:  Cervical exam deferred        Extremities: Normal range of motion.  Edema: None  Mental Status: Normal mood and affect. Normal behavior. Normal judgment and thought content.   Urinalysis:      Assessment and Plan:  Pregnancy: G2P1001 at [redacted]w[redacted]d  1. Supervision of high risk pregnancy, antepartum Routine care. Pt denies any trauma, movements, exacerbating s/s for the s/s she endorsed in the RN note. Pt states she is supposed to wear glasses but lost them and had some type of gtt she used (?abx) in the past that she got a year ago  but doesn't have it anymore. I told her that pregnancy can cause her visual acuity to change and that I recommend getting an updated glasses Rx for now and that she'll need another one after she delivers b/c her acuity may be different then.  - Flu Vaccine QUAD 36+ mos IM  2. Gestational diabetes mellitus (GDM), antepartum, gestational diabetes method of control unspecified Only has about 5 values and some just barely above range. Will see back in one week after her growth u/s to see how she's doing  3. History of low birth weight See above  4. History of gestational hypertension Continue low dose asa.   Preterm labor symptoms and general obstetric precautions including but not limited to vaginal bleeding, contractions, leaking of fluid and fetal movement were reviewed in detail with the patient. Please refer to After Visit Summary for other counseling recommendations.  Return in about 1 week (around 11/21/2017) for hrob visit. try and coordinate with her mfm u/s. Marland Kitchen   Rayle Bing, MD

## 2017-11-14 NOTE — Progress Notes (Signed)
[employer] [facility]

## 2017-11-21 ENCOUNTER — Encounter (HOSPITAL_COMMUNITY): Payer: Self-pay

## 2017-11-21 ENCOUNTER — Ambulatory Visit (HOSPITAL_COMMUNITY)
Admission: RE | Admit: 2017-11-21 | Discharge: 2017-11-21 | Disposition: A | Discharge status: Home / Self Care | Payer: Medicaid Other | Source: Ambulatory Visit | Attending: Obstetrics and Gynecology | Admitting: Obstetrics and Gynecology

## 2017-11-21 ENCOUNTER — Other Ambulatory Visit (HOSPITAL_COMMUNITY): Payer: Self-pay | Admitting: *Deleted

## 2017-11-21 DIAGNOSIS — Z362 Encounter for other antenatal screening follow-up: Secondary | ICD-10-CM | POA: Insufficient documentation

## 2017-11-21 DIAGNOSIS — Z3A31 31 weeks gestation of pregnancy: Secondary | ICD-10-CM | POA: Insufficient documentation

## 2017-11-21 DIAGNOSIS — Z87898 Personal history of other specified conditions: Secondary | ICD-10-CM

## 2017-11-21 DIAGNOSIS — O24419 Gestational diabetes mellitus in pregnancy, unspecified control: Secondary | ICD-10-CM | POA: Diagnosis not present

## 2017-11-21 DIAGNOSIS — O09293 Supervision of pregnancy with other poor reproductive or obstetric history, third trimester: Secondary | ICD-10-CM | POA: Diagnosis not present

## 2017-11-21 DIAGNOSIS — O09299 Supervision of pregnancy with other poor reproductive or obstetric history, unspecified trimester: Secondary | ICD-10-CM

## 2017-11-21 DIAGNOSIS — O24410 Gestational diabetes mellitus in pregnancy, diet controlled: Secondary | ICD-10-CM

## 2017-11-21 DIAGNOSIS — O0990 Supervision of high risk pregnancy, unspecified, unspecified trimester: Secondary | ICD-10-CM

## 2017-11-21 DIAGNOSIS — Z8759 Personal history of other complications of pregnancy, childbirth and the puerperium: Secondary | ICD-10-CM

## 2017-11-28 ENCOUNTER — Encounter: Payer: Self-pay | Admitting: Family Medicine

## 2017-11-28 ENCOUNTER — Ambulatory Visit (INDEPENDENT_AMBULATORY_CARE_PROVIDER_SITE_OTHER): Payer: Medicaid Other | Admitting: Family Medicine

## 2017-11-28 VITALS — BP 104/68 | HR 98 | Wt 147.5 lb

## 2017-11-28 DIAGNOSIS — O0990 Supervision of high risk pregnancy, unspecified, unspecified trimester: Secondary | ICD-10-CM

## 2017-11-28 DIAGNOSIS — O24419 Gestational diabetes mellitus in pregnancy, unspecified control: Secondary | ICD-10-CM

## 2017-11-28 DIAGNOSIS — Z8759 Personal history of other complications of pregnancy, childbirth and the puerperium: Secondary | ICD-10-CM

## 2017-11-28 DIAGNOSIS — O0993 Supervision of high risk pregnancy, unspecified, third trimester: Secondary | ICD-10-CM

## 2017-11-28 NOTE — Progress Notes (Signed)
   PRENATAL VISIT NOTE  Subjective:  Kerry Turner is a 27 y.o. G2P1001 at [redacted]w[redacted]d being seen today for ongoing prenatal care.  She is currently monitored for the following issues for this high-risk pregnancy and has Supervision of high risk pregnancy, antepartum; History of low birth weight; History of gestational hypertension; Pregnancy with poor obstetric history; Language barrier; and GDM (gestational diabetes mellitus) on their problem list.  Fasting: 69, 88, 84, 87, 68 Post-Prandial: 124, 114, 116, 118, 117, 107, 120, 110   Groin pain - worse at night, only on right side, "pins and needles"   Patient reports groin pain.  Contractions: Not present. Vag. Bleeding: None.  Movement: Present. Denies leaking of fluid.   The following portions of the patient's history were reviewed and updated as appropriate: allergies, current medications, past family history, past medical history, past social history, past surgical history and problem list. Problem list updated.  Objective:   Vitals:   11/28/17 1352  BP: 104/68  Pulse: 98  Weight: 147 lb 8 oz (66.9 kg)    Fetal Status: Fetal Heart Rate (bpm): 150 Fundal Height: 31 cm Movement: Present     General:  Alert, oriented and cooperative. Patient is in no acute distress.  Skin: Skin is warm and dry. No rash noted.   Cardiovascular: Normal heart rate noted  Respiratory: Normal respiratory effort, no problems with respiration noted  Abdomen: Soft, gravid, appropriate for gestational age.  Pain/Pressure: Present     Pelvic: Cervical exam deferred        Extremities: Normal range of motion.  Edema: None  Mental Status: Normal mood and affect. Normal behavior. Normal judgment and thought content.   Assessment and Plan:  Pregnancy: G2P1001 at [redacted]w[redacted]d  1. Supervision of high risk pregnancy, antepartum -- prenatal record reviewed and UTD -- weight gain appropriate   2. History of gestational hypertension -- not taking ASA, counseled on  importance in this pregnancy but declines -- BP well-controlled today  -- baseline UPC 0.20, HELLP labs wnl   3. A1GDM: Well-controlled with dietary changes. Most recent growth EFW 1548g (41%).  -- continue to check BGs 3-4 times daily -- has growth U/S scheduled at 35 weeks  -- counseled on diet   4. Groin Pain: Seems most consistent with neuropathic pain. Advised heat, gentle stretching and precautions for shingles discussed.   Preterm labor symptoms and general obstetric precautions including but not limited to vaginal bleeding, contractions, leaking of fluid and fetal movement were reviewed in detail with the patient. Please refer to After Visit Summary for other counseling recommendations.  Return in about 2 weeks (around 12/12/2017) for HROB.  Future Appointments  Date Time Provider Department Center  12/13/2017  3:15 PM Constant, Gigi Gin, MD [facility]  12/19/2017  2:15 PM [facility] [place] 4 [facility]   Tamera Stands, DO

## 2017-12-13 ENCOUNTER — Encounter: Payer: Medicaid Other | Admitting: Obstetrics and Gynecology

## 2017-12-14 ENCOUNTER — Encounter: Payer: Medicaid Other | Admitting: Obstetrics and Gynecology

## 2017-12-19 ENCOUNTER — Encounter (HOSPITAL_COMMUNITY): Payer: Self-pay

## 2017-12-19 ENCOUNTER — Ambulatory Visit (HOSPITAL_COMMUNITY)
Admission: RE | Admit: 2017-12-19 | Discharge: 2017-12-19 | Disposition: A | Discharge status: Home / Self Care | Payer: Medicaid Other | Source: Ambulatory Visit | Attending: Obstetrics and Gynecology | Admitting: Obstetrics and Gynecology

## 2017-12-19 DIAGNOSIS — Z362 Encounter for other antenatal screening follow-up: Secondary | ICD-10-CM

## 2017-12-19 DIAGNOSIS — Z3A35 35 weeks gestation of pregnancy: Secondary | ICD-10-CM | POA: Diagnosis not present

## 2017-12-19 DIAGNOSIS — Z87898 Personal history of other specified conditions: Secondary | ICD-10-CM

## 2017-12-19 DIAGNOSIS — O0990 Supervision of high risk pregnancy, unspecified, unspecified trimester: Secondary | ICD-10-CM

## 2017-12-19 DIAGNOSIS — O24410 Gestational diabetes mellitus in pregnancy, diet controlled: Secondary | ICD-10-CM | POA: Diagnosis not present

## 2017-12-19 DIAGNOSIS — Z8759 Personal history of other complications of pregnancy, childbirth and the puerperium: Secondary | ICD-10-CM

## 2017-12-19 DIAGNOSIS — O09293 Supervision of pregnancy with other poor reproductive or obstetric history, third trimester: Secondary | ICD-10-CM | POA: Diagnosis not present

## 2017-12-26 ENCOUNTER — Ambulatory Visit (INDEPENDENT_AMBULATORY_CARE_PROVIDER_SITE_OTHER): Payer: Medicaid Other | Admitting: Family Medicine

## 2017-12-26 ENCOUNTER — Other Ambulatory Visit (HOSPITAL_COMMUNITY)
Admission: RE | Admit: 2017-12-26 | Discharge: 2017-12-26 | Disposition: A | Discharge status: Home / Self Care | Payer: Medicaid Other | Source: Ambulatory Visit | Attending: Family Medicine | Admitting: Family Medicine

## 2017-12-26 VITALS — BP 109/77 | HR 118 | Wt 152.2 lb

## 2017-12-26 DIAGNOSIS — Z3A36 36 weeks gestation of pregnancy: Secondary | ICD-10-CM | POA: Diagnosis not present

## 2017-12-26 DIAGNOSIS — O0993 Supervision of high risk pregnancy, unspecified, third trimester: Secondary | ICD-10-CM | POA: Insufficient documentation

## 2017-12-26 DIAGNOSIS — O0990 Supervision of high risk pregnancy, unspecified, unspecified trimester: Secondary | ICD-10-CM

## 2017-12-26 NOTE — Patient Instructions (Signed)

## 2017-12-26 NOTE — Progress Notes (Signed)
   PRENATAL VISIT NOTE  Subjective:  Kerry Turner is a 27 y.o. G2P1001 at [redacted]w[redacted]d being seen today for ongoing prenatal care.  She is currently monitored for the following issues for this high-risk pregnancy and has Supervision of high risk pregnancy, antepartum; History of low birth weight; History of gestational hypertension; Pregnancy with poor obstetric history; Language barrier; and GDM (gestational diabetes mellitus) on their problem list.  Patient reports no complaints.  Contractions: Not present. Vag. Bleeding: None.  Movement: Present. Denies leaking of fluid.   The following portions of the patient's history were reviewed and updated as appropriate: allergies, current medications, past family history, past medical history, past social history, past surgical history and problem list. Problem list updated.  Objective:   Vitals:   12/26/17 1554  BP: 109/77  Pulse: (!) 118  Weight: 152 lb 3.2 oz (69 kg)    Fetal Status: Fetal Heart Rate (bpm): 157 Fundal Height: 29 cm Movement: Present     General:  Alert, oriented and cooperative. Patient is in no acute distress.  Skin: Skin is warm and dry. No rash noted.   Cardiovascular: Normal heart rate noted  Respiratory: Normal respiratory effort, no problems with respiration noted  Abdomen: Soft, gravid, appropriate for gestational age.  Pain/Pressure: Absent     Pelvic: Cervical exam deferred        Extremities: Normal range of motion.  Edema: None  Mental Status: Normal mood and affect. Normal behavior. Normal judgment and thought content.   Assessment and Plan:  Pregnancy: G2P1001 at [redacted]w[redacted]d  1. Supervision of high risk pregnancy, antepartum - GC/Chlamydia probe amp ([place])not at [facility] - Culture, beta strep (group b only)  2. GDM A1 FBS 60-90 2 hour pp 103-130 most are in range [place] 10/16 5 lb 2 oz, 2326 gms 41%, vtx, AFI 10  Preterm labor symptoms and general obstetric precautions including but not limited to vaginal  bleeding, contractions, leaking of fluid and fetal movement were reviewed in detail with the patient. Please refer to After Visit Summary for other counseling recommendations.  Return in 1 week (on 01/02/2018).  Future Appointments  Date Time Provider Department Center  01/08/2018  2:35 PM Gwenevere Abbot, MD [facility]    Reva Bores, MD

## 2017-12-27 LAB — GC/CHLAMYDIA PROBE AMP (~~LOC~~) NOT AT ARMC
CHLAMYDIA, DNA PROBE: NEGATIVE
Neisseria Gonorrhea: NEGATIVE

## 2017-12-30 LAB — CULTURE, BETA STREP (GROUP B ONLY): STREP GP B CULTURE: NEGATIVE

## 2018-01-08 ENCOUNTER — Ambulatory Visit (INDEPENDENT_AMBULATORY_CARE_PROVIDER_SITE_OTHER): Payer: Medicaid Other | Admitting: Family Medicine

## 2018-01-08 VITALS — BP 107/78 | HR 112 | Wt 154.6 lb

## 2018-01-08 DIAGNOSIS — O24419 Gestational diabetes mellitus in pregnancy, unspecified control: Secondary | ICD-10-CM

## 2018-01-08 DIAGNOSIS — O0993 Supervision of high risk pregnancy, unspecified, third trimester: Secondary | ICD-10-CM

## 2018-01-08 DIAGNOSIS — O0990 Supervision of high risk pregnancy, unspecified, unspecified trimester: Secondary | ICD-10-CM

## 2018-01-08 LAB — POCT URINALYSIS DIP (DEVICE)
BILIRUBIN URINE: NEGATIVE
GLUCOSE, UA: NEGATIVE mg/dL
Hgb urine dipstick: NEGATIVE
KETONES UR: NEGATIVE mg/dL
NITRITE: NEGATIVE
PH: 6 (ref 5.0–8.0)
Protein, ur: NEGATIVE mg/dL
Specific Gravity, Urine: 1.015 (ref 1.005–1.030)
Urobilinogen, UA: 1 mg/dL (ref 0.0–1.0)

## 2018-01-08 NOTE — Progress Notes (Signed)
   PRENATAL VISIT NOTE  Subjective:  Kerry Turner is a 27 y.o. G2P1001 at [redacted]w[redacted]d being seen today for ongoing prenatal care.  She is currently monitored for the following issues for this high-risk pregnancy and has Supervision of high risk pregnancy, antepartum; History of low birth weight; History of gestational hypertension; Pregnancy with poor obstetric history; Language barrier; and GDM (gestational diabetes mellitus) on their problem list.  Patient reports no complaints.  Contractions: Not present. Vag. Bleeding: None.  Movement: Present. Denies leaking of fluid.   The following portions of the patient's history were reviewed and updated as appropriate: allergies, current medications, past family history, past medical history, past social history, past surgical history and problem list. Problem list updated.  Objective:   Vitals:   01/08/18 1506  BP: 107/78  Pulse: (!) 112  Weight: 154 lb 9.6 oz (70.1 kg)    Fetal Status: Fetal Heart Rate (bpm): 150   Movement: Present     General:  Alert, oriented and cooperative. Patient is in no acute distress.  Skin: Skin is warm and dry. No rash noted.   Cardiovascular: Normal heart rate noted  Respiratory: Normal respiratory effort, no problems with respiration noted  Abdomen: Soft, gravid, appropriate for gestational age.  Pain/Pressure: Absent     Pelvic: Cervical exam deferred        Extremities: Normal range of motion.  Edema: None  Mental Status: Normal mood and affect. Normal behavior. Normal judgment and thought content.   Assessment and Plan:  Pregnancy: G2P1001 at [redacted]w[redacted]d  1. Supervision of high risk pregnancy, antepartum - doing well today - no concerns  2. Gestational diabetes mellitus (GDM), antepartum, gestational diabetes method of control unspecified - well controlled per last visit note, but not reviewed today  - growth 2326gm 41% on 12/19/17 - plan for induction at 40 weeks if continues to have good control   Term  labor symptoms and general obstetric precautions including but not limited to vaginal bleeding, contractions, leaking of fluid and fetal movement were reviewed in detail with the patient. Please refer to After Visit Summary for other counseling recommendations.  Return in about 1 week (around 01/15/2018) for ROB.  Future Appointments  Date Time Provider Department Center  01/15/2018  1:35 PM Mora Appl [facility]  01/22/2018 10:35 AM Katrinka Blazing, [place], CNM [facility]    Gwenevere Abbot, MD

## 2018-01-15 ENCOUNTER — Ambulatory Visit (INDEPENDENT_AMBULATORY_CARE_PROVIDER_SITE_OTHER): Payer: Medicaid Other | Admitting: Advanced Practice Midwife

## 2018-01-15 VITALS — BP 116/79 | HR 108 | Wt 155.6 lb

## 2018-01-15 DIAGNOSIS — Z789 Other specified health status: Secondary | ICD-10-CM

## 2018-01-15 DIAGNOSIS — Z3A38 38 weeks gestation of pregnancy: Secondary | ICD-10-CM

## 2018-01-15 DIAGNOSIS — O0990 Supervision of high risk pregnancy, unspecified, unspecified trimester: Secondary | ICD-10-CM

## 2018-01-15 DIAGNOSIS — O24410 Gestational diabetes mellitus in pregnancy, diet controlled: Secondary | ICD-10-CM

## 2018-01-15 LAB — POCT URINALYSIS DIP (DEVICE)
Bilirubin Urine: NEGATIVE
GLUCOSE, UA: NEGATIVE mg/dL
HGB URINE DIPSTICK: NEGATIVE
KETONES UR: NEGATIVE mg/dL
Nitrite: NEGATIVE
PROTEIN: NEGATIVE mg/dL
Specific Gravity, Urine: 1.025 (ref 1.005–1.030)
UROBILINOGEN UA: 0.2 mg/dL (ref 0.0–1.0)
pH: 7 (ref 5.0–8.0)

## 2018-01-15 NOTE — Progress Notes (Deleted)
   PRENATAL VISIT NOTE  Subjective:  Kerry Turner is a 27 y.o. G2P1001 at 2953w6d being seen today for ongoing prenatal care.  She is currently monitored for the following issues for this high-risk pregnancy and has Supervision of high risk pregnancy, antepartum; History of low birth weight; History of gestational hypertension; Pregnancy with poor obstetric history; Language barrier; and GDM (gestational diabetes mellitus) on their problem list.  Patient reports {sx:14538}.   .  .   . Denies leaking of fluid.   The following portions of the patient's history were reviewed and updated as appropriate: allergies, current medications, past family history, past medical history, past social history, past surgical history and problem list. Problem list updated.  Objective:  There were no vitals filed for this visit.  Fetal Status:           General:  Alert, oriented and cooperative. Patient is in no acute distress.  Skin: Skin is warm and dry. No rash noted.   Cardiovascular: Normal heart rate noted  Respiratory: Normal respiratory effort, no problems with respiration noted  Abdomen: Soft, gravid, appropriate for gestational age.        Pelvic: {Blank single:19197::"Cervical exam performed","Cervical exam deferred"}        Extremities: Normal range of motion.     Mental Status: Normal mood and affect. Normal behavior. Normal judgment and thought content.   Fasting CBGs: *** (***% out of range) PCB:  *** (***% out of range) PCL:  *** (***% out of range) PCD: *** (***% out of range)   Assessment and Plan:  Pregnancy: G2P1001 at 3053w6d  1. Diet controlled gestational diabetes mellitus (GDM) in third trimester - IOL 40 weeks  2. Language barrier - Interpreter used  3. Supervision of high risk pregnancy, antepartum ***  Term labor symptoms and general obstetric precautions including but not limited to vaginal bleeding, contractions, leaking of fluid and fetal movement were reviewed in detail  with the patient. Please refer to After Visit Summary for other counseling recommendations.  No follow-ups on file.  Future Appointments  Date Time Provider Department Center  01/22/2018 10:35 AM Katrinka BlazingSmith, [place], CNM [facility]    [place] Rastus Borton, [place]

## 2018-01-15 NOTE — Progress Notes (Signed)
Language Resources [place]  IOL scheduled for 01/23/18 @ 0730.  Pt notified.

## 2018-01-15 NOTE — Progress Notes (Signed)
Subjective:  Kerry Turner is a 27 y.o. G2P1001 at 548w6d being seen today for ongoing prenatal care.  She is currently monitored for the following issues for this high-risk pregnancy and has Supervision of high risk pregnancy, antepartum; History of low birth weight; History of gestational hypertension; Pregnancy with poor obstetric history; Language barrier; and GDM (gestational diabetes mellitus) on their problem list.  Patient reports trouble sleeping, doesn't feel sleepy at night. Frontal HA since last night. No visual changes. Hasn't tried anything. Contractions: Not present. Vag. Bleeding: None.  Movement: Present. Denies leaking of fluid.   The following portions of the patient's history were reviewed and updated as appropriate: allergies, current medications, past family history, past medical history, past social history, past surgical history and problem list. Problem list updated.  Objective:   Vitals:   01/15/18 1408  BP: 116/79  Pulse: (!) 108  Weight: 70.6 kg    Fetal Status: Fetal Heart Rate (bpm): 158 Fundal Height: 38 cm Movement: Present     General:  Alert, oriented and cooperative. Patient is in no acute distress.  Skin: Skin is warm and dry. No rash noted.   Cardiovascular: Normal heart rate noted  Respiratory: Normal respiratory effort, no problems with respiration noted  Abdomen: Soft, gravid, appropriate for gestational age. Pain/Pressure: Absent     Pelvic: Vag. Bleeding: None     Cervical exam deferred        Extremities: Normal range of motion.  Edema: None  Mental Status: Normal mood and affect. Normal behavior. Normal judgment and thought content.   Urinalysis:      Assessment and Plan:  Pregnancy: G2P1001 at 398w6d  1. Diet controlled gestational diabetes mellitus (GDM) in third trimester - didn't bring log, reports FBS 70-80s, and pp up to 116 - IOL scheduled for 11/20  2. Language barrier - live interpreter present  3. Supervision of high risk  pregnancy, antepartum  Term labor symptoms and general obstetric precautions including but not limited to vaginal bleeding, contractions, leaking of fluid and fetal movement were reviewed in detail with the patient. Please refer to After Visit Summary for other counseling recommendations.  Return in about 1 week (around 01/22/2018).   Donette LarryBhambri, [place], CNM

## 2018-01-15 NOTE — Patient Instructions (Signed)
AREA PEDIATRIC/FAMILY PRACTICE PHYSICIANS  [facility] [address], [address] [place], [place]  [address] Phone - [phone]   Fax - [phone]  [facility] [address] [place], [place] [address] Phone - [phone]   Fax - [phone]  [facility] AM[facility] [address] [place], [place]  [address] Phone - [phone]   Fax - [phone]  [facility] [address] [place], [place]  [address] Phone - [phone]   Fax - [phone]  [facility] [address] [place], [place]  [address] Phone - [phone]   Fax - [phone]  [facility] [address] [place], [place]  [address] Phone - [phone]   Fax - [phone]  [facility] [address] [place], [place]  [address] Phone - [phone]   Fax - [phone]  [facility] [address], [address] [place], [place]  [address] Phone - [phone]   Fax - [phone]  [facility] [address] [place], [place]  [address] Phone - [phone]   Fax - [phone] [facility] [address] [place], [place]  [address] Phone - [phone]   Fax - [phone]  [facility] [address] [place], [place]  [address] Phone - [phone]   Fax - [phone]  [facility] [address] [place], [place]  [address] Phone - [phone]   Fax - [phone]  [facility] [address] [place], [place]  [address] Phone - [phone]   Fax - [phone]  SHILPA GOSRANI [address] [place], [place]  [address] Phone - [phone]  [facility] [address] [place], [place]  [address] Phone - [phone]   Fax - [phone]  [facility] [address] [place], [place]  [address] Phone - [phone]   Fax - [phone]  [facility] [address] [place], [place]  [address] Phone - [phone]   Fax - [phone]  [facility] [address] [place], [place]  [address] Phone - [phone]   Fax - [phone]   [facility] [address] [place], [place]  [address] Phone - [phone]   Fax - [phone]  [facility] [address] [place], [place]  [address] Phone - [phone]   Fax - [phone]  DAVID RUBIN [address] [place], [place]  [address] Phone - [phone]   Fax - [phone]  [facility] [address] [place], [place]  [address] Phone - [phone]   Fax - [phone]  [facility] [address] , [place]  [address] Phone - [phone]   Fax - [phone] [facility] [address] [place], [place]  [address] Phone - [phone]   Fax - [phone]  [facility] [address] [place], [place]  [address] Phone - [phone]   Fax - [phone]   [facility] [address] [place],   [address] Phone - [phone]   Fax - [phone]  [facility] Charlene Flemming MD [address] [place]  [address] Phone [phone]  Fax [phone]    Contraception Choices Contraception, also called birth control, means things to use or ways   to try not to get pregnant. Hormonal birth control This kind of birth control uses hormones. Here are some types of hormonal birth control:  A tube that is put under skin of the arm (implant). The tube can stay in for as long as 3 years.  Shots to get every 3 months (injections).  Pills to take every day (birth control pills).  A patch to change 1 time each week for 3 weeks (birth control  patch). After that, the patch is taken off for 1 week.  A ring to put in the vagina. The ring is left in for 3 weeks. Then it is taken out of the vagina for 1 week. Then a new ring is put in.  Pills to take after unprotected sex (emergency birth control pills).  Barrier birth control Here are some types of barrier birth control:  A thin covering that is put on the penis before sex (female condom). The covering is thrown away after sex.  A soft, loose covering that is put in the vagina before sex (female condom). The covering is thrown away after sex.  A rubber bowl that sits over the cervix (diaphragm). The bowl must be made for you. The bowl is put into the vagina before sex. The bowl is left in for 6-8 hours after sex. It is taken out within 24 hours.  A small, soft cup that fits over the cervix (cervical cap). The cup must be made for you. The cup can be left in for 6-8 hours after sex. It is taken out within 48 hours.  A sponge that is put into the vagina before sex. It must be left in for at least 6 hours after sex. It must be taken out within 30 hours. Then it is thrown away.  A chemical that kills or stops sperm from getting into the uterus (spermicide). It may be a pill, cream, jelly, or foam to put in the vagina. The chemical should be used at least 10-15 minutes before sex.  IUD (intrauterine) birth control An IUD is a small, T-shaped piece of plastic. It is put inside the uterus. There are two kinds:  Hormone IUD. This kind can stay in for 3-5 years.  Copper IUD. This kind can stay in for 10 years.  Permanent birth control Here are some types of permanent birth control:  Surgery to block the fallopian tubes.  Having an insert put into each fallopian tube.  Surgery to tie off the tubes that carry sperm (vasectomy).  Natural planning birth control Here are some types of natural planning birth control:  Not having sex on the days the woman could get pregnant.  Using a  calendar: ? To keep track of the length of each period. ? To find out what days pregnancy can happen. ? To plan to not have sex on days when pregnancy can happen.  Watching for symptoms of ovulation and not having sex during ovulation. One way the woman can check for ovulation is to check her temperature.  Waiting to have sex until after ovulation.  Summary  Contraception, also called birth control, means things to use or ways to try not to get pregnant.  Hormonal methods of birth control include implants, injections, pills, patches, vaginal rings, and emergency birth control pills.  Barrier methods of birth control can include female condoms, female condoms, diaphragms, cervical caps, sponges, and spermicides.  There are two types of IUD (intrauterine device) birth control. An IUD can be put in a   woman's uterus to prevent pregnancy for 3-5 years.  Permanent sterilization can be done through a procedure for males, females, or both.  Natural planning methods involve not having sex on the days when the woman could get pregnant. This information is not intended to replace advice given to you by your health care provider. Make sure you discuss any questions you have with your health care provider. Document Released: 12/18/2008 Document Revised: 03/02/2016 Document Reviewed: 03/02/2016 Elsevier Interactive Patient Education  2017 Elsevier Inc.  

## 2018-01-16 ENCOUNTER — Telehealth (HOSPITAL_COMMUNITY): Payer: Self-pay | Admitting: *Deleted

## 2018-01-16 NOTE — Telephone Encounter (Signed)
Preadmission screen Interpreter number [phone]

## 2018-01-22 ENCOUNTER — Encounter: Payer: Self-pay | Admitting: Advanced Practice Midwife

## 2018-01-22 ENCOUNTER — Ambulatory Visit (INDEPENDENT_AMBULATORY_CARE_PROVIDER_SITE_OTHER): Payer: Medicaid Other | Admitting: Advanced Practice Midwife

## 2018-01-22 VITALS — BP 107/70 | HR 99 | Wt 157.5 lb

## 2018-01-22 DIAGNOSIS — Z789 Other specified health status: Secondary | ICD-10-CM

## 2018-01-22 DIAGNOSIS — O24410 Gestational diabetes mellitus in pregnancy, diet controlled: Secondary | ICD-10-CM

## 2018-01-22 DIAGNOSIS — Z3A39 39 weeks gestation of pregnancy: Secondary | ICD-10-CM

## 2018-01-22 DIAGNOSIS — O0990 Supervision of high risk pregnancy, unspecified, unspecified trimester: Secondary | ICD-10-CM

## 2018-01-22 DIAGNOSIS — O0993 Supervision of high risk pregnancy, unspecified, third trimester: Secondary | ICD-10-CM

## 2018-01-22 NOTE — Progress Notes (Signed)
   PRENATAL VISIT NOTE  Subjective:  Kerry Turner is a 27 y.o. G2P1001 at 7654w6d being seen today for ongoing prenatal care.  She is currently monitored for the following issues for this high-risk pregnancy and has Supervision of high risk pregnancy, antepartum; History of low birth weight; History of gestational hypertension; Pregnancy with poor obstetric history; Language barrier; and GDM (gestational diabetes mellitus) on their problem list.  Patient reports no complaints.  Contractions: Not present. Vag. Bleeding: None.  Movement: Present. Denies leaking of fluid.   The following portions of the patient's history were reviewed and updated as appropriate: allergies, current medications, past family history, past medical history, past social history, past surgical history and problem list. Problem list updated.  Live interpreter used.  Objective:   Vitals:   01/22/18 1113  BP: 107/70  Pulse: 99  Weight: 157 lb 8 oz (71.4 kg)    Fetal Status: Fetal Heart Rate (bpm): 159   Movement: Present     General:  Alert, oriented and cooperative. Patient is in no acute distress.  Skin: Skin is warm and dry. No rash noted.   Cardiovascular: Normal heart rate noted  Respiratory: Normal respiratory effort, no problems with respiration noted  Abdomen: Soft, gravid, appropriate for gestational age.  Pain/Pressure: Present     Pelvic: Cervical exam deferred        Extremities: Normal range of motion.  Edema: Trace  Mental Status: Normal mood and affect. Normal behavior. Normal judgment and thought content.   All CBGs in range  Assessment and Plan:  Pregnancy: G2P1001 at 3154w6d  1. Supervision of high risk pregnancy, antepartum   2. Diet controlled gestational diabetes mellitus (GDM) in third trimester - IOL tomorrow  3. Language barrier - Live interpreter used  Term labor symptoms and general obstetric precautions including but not limited to vaginal bleeding, contractions, leaking of fluid  and fetal movement were reviewed in detail with the patient. Please refer to After Visit Summary for other counseling recommendations.  IOL tomorrow  Future Appointments  Date Time Provider Department Center  01/23/2018  7:30 AM [facility] RO[facility] None    Dorathy KinsmanVirginia Holiday [place], [place]

## 2018-01-22 NOTE — Patient Instructions (Addendum)
Labor Induction Labor induction is when steps are taken to cause a pregnant woman to begin the labor process. Most women go into labor on their own between 37 weeks and 42 weeks of the pregnancy. When this does not happen or when there is a medical need, methods may be used to induce labor. Labor induction causes a pregnant woman's uterus to contract. It also causes the cervix to soften (ripen), open (dilate), and thin out (efface). Usually, labor is not induced before 39 weeks of the pregnancy unless there is a problem with the baby or mother. Before inducing labor, your health care provider will consider a number of factors, including the following:  The medical condition of you and the baby.  How many weeks along you are.  The status of the baby's lung maturity.  The condition of the cervix.  The position of the baby. What are the reasons for labor induction? Labor may be induced for the following reasons:  The health of the baby or mother is at risk.  The pregnancy is overdue by 1 week or more.  The water breaks but labor does not start on its own.  The mother has a health condition or serious illness, such as high blood pressure, infection, placental abruption, or diabetes.  The amniotic fluid amounts are low around the baby.  The baby is distressed. Convenience or wanting the baby to be born on a certain date is not a reason for inducing labor. What methods are used for labor induction? Several methods of labor induction may be used, such as:  Prostaglandin medicine. This medicine causes the cervix to dilate and ripen. The medicine will also start contractions. It can be taken by mouth or by inserting a suppository into the vagina.  Inserting a thin tube (catheter) with a balloon on the end into the vagina to dilate the cervix. Once inserted, the balloon is expanded with water, which causes the cervix to open.  Stripping the membranes. Your health care provider separates  amniotic sac tissue from the cervix, causing the cervix to be stretched and causing the release of a hormone called progesterone. This may cause the uterus to contract. It is often done during an office visit. You will be sent home to wait for the contractions to begin. You will then come in for an induction.  Breaking the water. Your health care provider makes a hole in the amniotic sac using a small instrument. Once the amniotic sac breaks, contractions should begin. This may still take hours to see an effect.  Medicine to trigger or strengthen contractions. This medicine is given through an IV access tube inserted into a vein in your arm. All of the methods of induction, besides stripping the membranes, will be done in the hospital. Induction is done in the hospital so that you and the baby can be carefully monitored. How long does it take for labor to be induced? Some inductions can take up to 2-3 days. Depending on the cervix, it usually takes less time. It takes longer when you are induced early in the pregnancy or if this is your first pregnancy. If a mother is still pregnant and the induction has been going on for 2-3 days, either the mother will be sent home or a cesarean delivery will be needed. What are the risks associated with labor induction? Some of the risks of induction include:  Changes in fetal heart rate, such as too high, too low, or erratic.  Fetal distress.    Chance of infection for the mother and baby.  Increased chance of having a cesarean delivery.  Breaking off (abruption) of the placenta from the uterus (rare).  Uterine rupture (very rare). When induction is needed for medical reasons, the benefits of induction may outweigh the risks. What are some reasons for not inducing labor? Labor induction should not be done if:  It is shown that your baby does not tolerate labor.  You have had previous surgeries on your uterus, such as a myomectomy or the removal of  fibroids.  Your placenta lies very low in the uterus and blocks the opening of the cervix (placenta previa).  Your baby is not in a head-down position.  The umbilical cord drops down into the birth canal in front of the baby. This could cut off the baby's blood and oxygen supply.  You have had a previous cesarean delivery.  There are unusual circumstances, such as the baby being extremely premature. This information is not intended to replace advice given to you by your health care provider. Make sure you discuss any questions you have with your health care provider. Document Released: 07/12/2006 Document Revised: 07/29/2015 Document Reviewed: 09/19/2012 Elsevier Interactive Patient Education  2017 Elsevier Inc.  

## 2018-01-23 ENCOUNTER — Other Ambulatory Visit: Payer: Self-pay

## 2018-01-23 ENCOUNTER — Encounter (HOSPITAL_COMMUNITY): Payer: Self-pay

## 2018-01-23 ENCOUNTER — Inpatient Hospital Stay (HOSPITAL_COMMUNITY)
Admission: RE | Admit: 2018-01-23 | Discharge: 2018-01-25 | DRG: 807 | Disposition: A | Discharge status: Home / Self Care | Payer: Medicaid Other | Attending: Obstetrics & Gynecology | Admitting: Obstetrics & Gynecology

## 2018-01-23 DIAGNOSIS — O480 Post-term pregnancy: Secondary | ICD-10-CM | POA: Diagnosis present

## 2018-01-23 DIAGNOSIS — Z23 Encounter for immunization: Secondary | ICD-10-CM | POA: Diagnosis not present

## 2018-01-23 DIAGNOSIS — O24410 Gestational diabetes mellitus in pregnancy, diet controlled: Secondary | ICD-10-CM | POA: Diagnosis present

## 2018-01-23 DIAGNOSIS — O326XX0 Maternal care for compound presentation, not applicable or unspecified: Secondary | ICD-10-CM | POA: Diagnosis present

## 2018-01-23 DIAGNOSIS — Z3A40 40 weeks gestation of pregnancy: Secondary | ICD-10-CM

## 2018-01-23 DIAGNOSIS — Z8759 Personal history of other complications of pregnancy, childbirth and the puerperium: Secondary | ICD-10-CM

## 2018-01-23 DIAGNOSIS — Z87898 Personal history of other specified conditions: Secondary | ICD-10-CM

## 2018-01-23 DIAGNOSIS — O24420 Gestational diabetes mellitus in childbirth, diet controlled: Secondary | ICD-10-CM | POA: Diagnosis present

## 2018-01-23 DIAGNOSIS — Z603 Acculturation difficulty: Secondary | ICD-10-CM | POA: Diagnosis present

## 2018-01-23 DIAGNOSIS — O0990 Supervision of high risk pregnancy, unspecified, unspecified trimester: Secondary | ICD-10-CM

## 2018-01-23 DIAGNOSIS — Z789 Other specified health status: Secondary | ICD-10-CM | POA: Diagnosis present

## 2018-01-23 HISTORY — DX: Unspecified abnormal cytological findings in specimens from vagina: R87.629

## 2018-01-23 HISTORY — DX: Type 2 diabetes mellitus without complications: E11.9

## 2018-01-23 LAB — CBC
HEMATOCRIT: 35.6 % — AB (ref 36.0–46.0)
Hemoglobin: 11.4 g/dL — ABNORMAL LOW (ref 12.0–15.0)
MCH: 28.9 pg (ref 26.0–34.0)
MCHC: 32 g/dL (ref 30.0–36.0)
MCV: 90.4 fL (ref 80.0–100.0)
NRBC: 0 % (ref 0.0–0.2)
Platelets: 218 10*3/uL (ref 150–400)
RBC: 3.94 MIL/uL (ref 3.87–5.11)
RDW: 14.2 % (ref 11.5–15.5)
WBC: 12.2 10*3/uL — ABNORMAL HIGH (ref 4.0–10.5)

## 2018-01-23 LAB — GLUCOSE, CAPILLARY
GLUCOSE-CAPILLARY: 129 mg/dL — AB (ref 70–99)
Glucose-Capillary: 163 mg/dL — ABNORMAL HIGH (ref 70–99)
Glucose-Capillary: 97 mg/dL (ref 70–99)
Glucose-Capillary: 76 mg/dL (ref 70–99)

## 2018-01-23 LAB — TYPE AND SCREEN
ABO/RH(D): AB POS
ANTIBODY SCREEN: NEGATIVE

## 2018-01-23 LAB — RPR: RPR Ser Ql: NONREACTIVE

## 2018-01-23 LAB — ABO/RH: ABO/RH(D): AB POS

## 2018-01-23 MED ORDER — TERBUTALINE SULFATE 1 MG/ML IJ SOLN
0.2500 mg | Freq: Once | INTRAMUSCULAR | Status: DC | PRN
  Filled 2018-01-23: qty 1

## 2018-01-23 MED ORDER — BENZOCAINE-MENTHOL 20-0.5 % EX AERO
1.0000 "application " | INHALATION_SPRAY | CUTANEOUS | Status: DC | PRN
  Administered 2018-01-24: 1 via TOPICAL
  Filled 2018-01-23: qty 56

## 2018-01-23 MED ORDER — FENTANYL CITRATE (PF) 100 MCG/2ML IJ SOLN: 50.0000 ug | INTRAMUSCULAR | Status: DC | PRN

## 2018-01-23 MED ORDER — FLEET ENEMA 7-19 GM/118ML RE ENEM: 1.0000 | ENEMA | RECTAL | Status: DC | PRN

## 2018-01-23 MED ORDER — LACTATED RINGERS IV SOLN
INTRAVENOUS | Status: DC
  Administered 2018-01-23 (×2): via INTRAVENOUS

## 2018-01-23 MED ORDER — INSULIN ASPART 100 UNIT/ML ~~LOC~~ SOLN: 2.0000 [IU] | Freq: Once | SUBCUTANEOUS | Status: DC

## 2018-01-23 MED ORDER — ACETAMINOPHEN 325 MG PO TABS: 650.0000 mg | ORAL_TABLET | ORAL | Status: DC | PRN

## 2018-01-23 MED ORDER — DIPHENHYDRAMINE HCL 25 MG PO CAPS: 25.0000 mg | ORAL_CAPSULE | Freq: Four times a day (QID) | ORAL | Status: DC | PRN

## 2018-01-23 MED ORDER — OXYTOCIN 40 UNITS IN LACTATED RINGERS INFUSION - SIMPLE MED
1.0000 m[IU]/min | INTRAVENOUS | Status: DC
  Administered 2018-01-23: 2 m[IU]/min via INTRAVENOUS

## 2018-01-23 MED ORDER — OXYCODONE-ACETAMINOPHEN 5-325 MG PO TABS: 1.0000 | ORAL_TABLET | ORAL | Status: DC | PRN

## 2018-01-23 MED ORDER — WITCH HAZEL-GLYCERIN EX PADS: 1.0000 "application " | MEDICATED_PAD | CUTANEOUS | Status: DC | PRN

## 2018-01-23 MED ORDER — ONDANSETRON HCL 4 MG PO TABS: 4.0000 mg | ORAL_TABLET | ORAL | Status: DC | PRN

## 2018-01-23 MED ORDER — LACTATED RINGERS IV SOLN: 500.0000 mL | INTRAVENOUS | Status: DC | PRN

## 2018-01-23 MED ORDER — FENTANYL CITRATE (PF) 100 MCG/2ML IJ SOLN
100.0000 ug | INTRAMUSCULAR | Status: DC | PRN
  Administered 2018-01-23 (×3): 100 ug via INTRAVENOUS
  Filled 2018-01-23 (×3): qty 2

## 2018-01-23 MED ORDER — SIMETHICONE 80 MG PO CHEW: 80.0000 mg | CHEWABLE_TABLET | ORAL | Status: DC | PRN

## 2018-01-23 MED ORDER — ZOLPIDEM TARTRATE 5 MG PO TABS: 5.0000 mg | ORAL_TABLET | Freq: Every evening | ORAL | Status: DC | PRN

## 2018-01-23 MED ORDER — PRENATAL MULTIVITAMIN CH
1.0000 | ORAL_TABLET | Freq: Every day | ORAL | Status: DC
  Administered 2018-01-24 – 2018-01-25 (×2): 1 via ORAL
  Filled 2018-01-23 (×2): qty 1

## 2018-01-23 MED ORDER — LIDOCAINE HCL (PF) 1 % IJ SOLN
30.0000 mL | INTRAMUSCULAR | Status: DC | PRN
  Administered 2018-01-23: 30 mL via SUBCUTANEOUS
  Filled 2018-01-23: qty 30

## 2018-01-23 MED ORDER — DIBUCAINE 1 % RE OINT: 1.0000 "application " | TOPICAL_OINTMENT | RECTAL | Status: DC | PRN

## 2018-01-23 MED ORDER — ONDANSETRON HCL 4 MG/2ML IJ SOLN: 4.0000 mg | INTRAMUSCULAR | Status: DC | PRN

## 2018-01-23 MED ORDER — TETANUS-DIPHTH-ACELL PERTUSSIS 5-2.5-18.5 LF-MCG/0.5 IM SUSP: 0.5000 mL | Freq: Once | INTRAMUSCULAR | Status: DC

## 2018-01-23 MED ORDER — MISOPROSTOL 25 MCG QUARTER TABLET
25.0000 ug | ORAL_TABLET | ORAL | Status: DC | PRN
  Administered 2018-01-23 (×2): 25 ug via VAGINAL
  Filled 2018-01-23 (×3): qty 1

## 2018-01-23 MED ORDER — OXYTOCIN BOLUS FROM INFUSION
500.0000 mL | Freq: Once | INTRAVENOUS | Status: AC
  Administered 2018-01-23: 500 mL via INTRAVENOUS

## 2018-01-23 MED ORDER — MEASLES, MUMPS & RUBELLA VAC IJ SOLR
0.5000 mL | Freq: Once | INTRAMUSCULAR | Status: AC
  Administered 2018-01-25: 0.5 mL via SUBCUTANEOUS
  Filled 2018-01-23 (×2): qty 0.5

## 2018-01-23 MED ORDER — OXYCODONE-ACETAMINOPHEN 5-325 MG PO TABS: 2.0000 | ORAL_TABLET | ORAL | Status: DC | PRN

## 2018-01-23 MED ORDER — SENNOSIDES-DOCUSATE SODIUM 8.6-50 MG PO TABS
2.0000 | ORAL_TABLET | ORAL | Status: DC
  Administered 2018-01-23 – 2018-01-24 (×2): 2 via ORAL
  Filled 2018-01-23 (×2): qty 2

## 2018-01-23 MED ORDER — SOD CITRATE-CITRIC ACID 500-334 MG/5ML PO SOLN: 30.0000 mL | ORAL | Status: DC | PRN

## 2018-01-23 MED ORDER — ONDANSETRON HCL 4 MG/2ML IJ SOLN: 4.0000 mg | Freq: Four times a day (QID) | INTRAMUSCULAR | Status: DC | PRN

## 2018-01-23 MED ORDER — OXYTOCIN 40 UNITS IN LACTATED RINGERS INFUSION - SIMPLE MED
2.5000 [IU]/h | INTRAVENOUS | Status: DC
  Administered 2018-01-23: 2.5 [IU]/h via INTRAVENOUS
  Filled 2018-01-23: qty 1000

## 2018-01-23 MED ORDER — COCONUT OIL OIL: 1.0000 "application " | TOPICAL_OIL | Status: DC | PRN

## 2018-01-23 MED ORDER — IBUPROFEN 600 MG PO TABS
600.0000 mg | ORAL_TABLET | Freq: Four times a day (QID) | ORAL | Status: DC
  Administered 2018-01-23 – 2018-01-25 (×7): 600 mg via ORAL
  Filled 2018-01-23 (×7): qty 1

## 2018-01-23 NOTE — Anesthesia Pain Management Evaluation Note (Signed)
  CRNA Pain Management Visit Note  Patient: Kerry Turner, 27 y.o., female  "Hello I am a member of the anesthesia team at [facility]. We have an anesthesia team available at all times to provide care throughout the hospital, including epidural management and anesthesia for C-section. I don't know your plan for the delivery whether it a natural birth, water birth, IV sedation, nitrous supplementation, doula or epidural, but we want to meet your pain goals."   1.Was your pain managed to your expectations on prior hospitalizations?   Yes   2.What is your expectation for pain management during this hospitalization?     Epidural  3.How can we help you reach that goal? epidural  Record the patient's initial score and the patient's pain goal.   Pain: 5  Pain Goal: 7 [facility] wants you to be able to say your pain was always managed very well.  Cleda ClarksBrowder, Alexina Niccoli R 01/23/2018

## 2018-01-23 NOTE — Progress Notes (Signed)
Interpreter [phone]#[id number] used to explain POC & SVE.

## 2018-01-23 NOTE — Progress Notes (Signed)
LABOR PROGRESS NOTE  Karie SodaDevi Haraway is a 27 y.o. G2P1001 at 7257w0d  admitted for IOL.  Subjective: Interpreter [phone] Patient states that she is doing well. She is hungry and wants to bring food from home.   Objective: BP 105/69   Pulse 95   Temp 98.2 F (36.8 C) (Oral)   Resp 16   Ht 4\' 11"  (1.499 m)   Wt 71.4 kg   LMP 04/18/2017 (Exact Date)   BMI 31.81 kg/m  or  Vitals:   01/23/18 0802 01/23/18 0823 01/23/18 1036 01/23/18 1216  BP: 120/69  116/71 105/69  Pulse: (!) 119  93 95  Resp: 18  16 16   Temp: 98.2 F (36.8 C)     TempSrc: Oral     Weight:  71.4 kg    Height:  4\' 11"  (1.499 m)      Cervix check and cytotec placement Dilation: 3 Effacement (%): Thick Cervical Position: Anterior Station: -2 Presentation: Vertex Exam by:: jaton burgess rnc FHT: baseline rate 150, moderate varibility, ocasional acel, no decel Toco: external  Labs: Lab Results  Component Value Date   WBC 12.2 (H) 01/23/2018   HGB 11.4 (L) 01/23/2018   HCT 35.6 (L) 01/23/2018   MCV 90.4 01/23/2018   PLT 218 01/23/2018    Patient Active Problem List   Diagnosis Date Noted  . Gestational diabetes, diet controlled 01/23/2018  . GDM (gestational diabetes mellitus) 11/06/2017  . Language barrier 09/19/2017  . Pregnancy with poor obstetric history   . Supervision of high risk pregnancy, antepartum 08/09/2017  . History of low birth weight 08/09/2017  . History of gestational hypertension 08/09/2017    Assessment / Plan: 27 y.o. G2P1001 at 3357w0d here for induction of labor for post dates and A1DM. Patient progressed to 3cm dilation and thick.  Labor: administer another 25mg  cytotec vaginally Fetal Wellbeing:  Category I Pain Control:  None at this time Anticipated MOD:  vaginal  Jamelle Rushinghelsey Tarus Briski DO 01/23/2018, 12:39 PM

## 2018-01-23 NOTE — H&P (Addendum)
LABOR AND DELIVERY ADMISSION HISTORY AND PHYSICAL NOTE  *Nepali live interpretor present for visit* Kerry Turner is a 27 y.o. female G2P1001 with IUP at 1329w0d by L/19 presenting for IOL secondary to A1GDM.   She reports positive fetal movement. She denies leakage of fluid or vaginal bleeding. She endorses mild contractions that are irregular.   [facility]/Complications[facility] Pregnancy complications:  - A1GDM - Hx of low birth weight - Hx of gHTN  Past Medical History: Past Medical History:  Diagnosis Date  . Diabetes mellitus without complication (HCC)   . Pregnancy induced hypertension   . Vaginal Pap smear, abnormal     Past Surgical History: Past Surgical History:  Procedure Laterality Date  . BREAST SURGERY     had surgery on a bone under the breast    Obstetrical History: OB History    Gravida  2   Para  1   Term  1   Preterm  0   AB  0   Living  1     SAB  0   TAB  0   Ectopic  0   Multiple  0   Live Births  1           Social History: Social History   Socioeconomic History  . Marital status: Single    Spouse name: Not on file  . Number of children: Not on file  . Years of education: Not on file  . Highest education level: Not on file  Occupational History  . Not on file  Social Needs  . Financial resource strain: Not on file  . Food insecurity:    Worry: Not on file    Inability: Not on file  . Transportation needs:    Medical: Not on file    Non-medical: Not on file  Tobacco Use  . Smoking status: Never Smoker  . Smokeless tobacco: Never Used  Substance and Sexual Activity  . Alcohol use: Never    Frequency: Never  . Drug use: Never  . Sexual activity: Yes  Lifestyle  . Physical activity:    Days per week: Not on file    Minutes per session: Not on file  . Stress: Not on file  Relationships  . Social connections:    Talks on phone: Not on file    Gets together: Not on  file    Attends religious service: Not on file    Active member of club or organization: Not on file    Attends meetings of clubs or organizations: Not on file    Relationship status: Not on file  Other Topics Concern  . Not on file  Social History Narrative  . Not on file    Family History: Family History  Problem Relation Age of Onset  . Hypertension Mother     Allergies: No Known Allergies  Medications Prior to Admission  Medication Sig Dispense Refill Last Dose  . ACCU-CHEK FASTCLIX LANCETS MISC 1 Device by Percutaneous route 4 (four) times daily. 100 each 12 Taking  . aspirin EC 81 MG tablet Take 1 tablet (81 mg total) by mouth daily. Take after 12 weeks for prevention of preeclampsia later in pregnancy (Patient not taking: Reported on 11/21/2017) 60 tablet 2 Not Taking  . glucose blood (ACCU-CHEK GUIDE) test strip Use as instructed QID 100 each 12 Taking  . Prenatal Vit-Fe Fumarate-FA (PREPLUS) 27-1 MG TABS Take 1 tablet by mouth daily. 60 tablet  3 Taking     Review of Systems  All systems reviewed and negative except as stated in HPI  Physical Exam Blood pressure 120/69, pulse (!) 119, temperature 98.2 F (36.8 C), temperature source Oral, resp. rate 18, height 4\' 11"  (1.499 m), weight 71.4 kg, last menstrual period 04/18/2017. General appearance: alert, oriented, NAD Lungs: normal respiratory effort Heart: regular rate Abdomen: soft, non-tender; gravid, FH appropriate for GA Extremities: No calf swelling or tenderness Presentation: cephalic by RN check  Fetal monitoring: 150s Moderate variability. No decelerations.  Uterine activity:  contractions, irregular.  Dilation: Fingertip Effacement (%): Thick Station: -2 Exam by:: raney gagnon rnc  Prenatal labs: ABO, Rh: AB/Positive/-- (05/16 0000) Antibody: Negative (05/16 0000) Rubella: Nonimmune (05/16 0000) RPR: Non Reactive (08/30 0940)  HBsAg: Negative (05/16 0000)  HIV: Non Reactive (08/30 0940)   GC/Chlamydia: negative. GBS:   Negative. 2-hr GTT: 120 (08/30)  Genetic screening:  Low risk NIPS Anatomy US: Normal. Flu Shot: Yes. Tetanus Shot: Unknown.   Prenatal Transfer Tool  Maternal Diabetes: Yes:  Diabetes Type:  Diet controlled Genetic Screening: Declined Maternal Ultrasounds/Referrals: Normal Fetal Ultrasounds or other Referrals:  None Maternal Substance Abuse:  No Significant Maternal Medications:  None Significant Maternal Lab Results: Lab values include: Group B Strep negative  Results for orders placed or performed during the hospital encounter of 01/23/18 (from the past 24 hour(s))  CBC   Collection Time: 01/23/18  8:00 AM  Result Value Ref Range   WBC 12.2 (H) 4.0 - 10.5 K/uL   RBC 3.94 3.87 - 5.11 MIL/uL   Hemoglobin 11.4 (L) 12.0 - 15.0 g/dL   HCT 16.1 (L) 09.6 - 04.5 %   MCV 90.4 80.0 - 100.0 fL   MCH 28.9 26.0 - 34.0 pg   MCHC 32.0 30.0 - 36.0 g/dL   RDW 40.9 81.1 - 91.4 %   Platelets 218 150 - 400 K/uL   nRBC 0.0 0.0 - 0.2 %  Glucose, capillary   Collection Time: 01/23/18  8:14 AM  Result Value Ref Range   Glucose-Capillary 163 (H) 70 - 99 mg/dL    Patient Active Problem List   Diagnosis Date Noted  . Gestational diabetes, diet controlled 01/23/2018  . GDM (gestational diabetes mellitus) 11/06/2017  . Language barrier 09/19/2017  . Pregnancy with poor obstetric history   . Supervision of high risk pregnancy, antepartum 08/09/2017  . History of low birth weight 08/09/2017  . History of gestational hypertension 08/09/2017    Assessment: Kerry Turner is a 27 y.o. G2P1001 at [redacted]w[redacted]d here for IOL secondary to A1GDM.  #Labor: Plan for IOL today. Will start induction with cytotec and then try to place FB at next check.  #Pain: She reports mild abdominal tenderness and back pain. Contractions are irregular and mild. Undecided on pain control  #FWB: Positive fetal movement. No decelerations. EFW 41% at 35 weeks. Cephalic, GBS(-) #MOF: Breast and  bottle feeding #MOC: Depo shot #A1GDM: Check BG every 4 hours, if persistently > 120, will plan to start glucose stabilizer.   [name], PA-S. 01/23/2018, 8:43 AM   Attestation: I have seen this patient and agree with the physician assistant student's documentation. I have examined them separately, and we have discussed the plan of care.  Cristal Deer. Earlene Plater, DO OB/GYN Fellow

## 2018-01-23 NOTE — Progress Notes (Signed)
LABOR PROGRESS NOTE  Kerry Turner is a 27 y.o. G2P1001 at 6644w0d  Subjective: Patient is starting to feel increased discomfort with contractions. She would like to eat some soup. She is requesting pain medication at this time and remains undecided.  Objective: BP 127/75   Pulse 100   Temp 98 F (36.7 C) (Oral)   Resp 16   Ht 4\' 11"  (1.499 m)   Wt 71.4 kg   LMP 04/18/2017 (Exact Date)   BMI 31.81 kg/m  or  Vitals:   01/23/18 1216 01/23/18 1407 01/23/18 1515 01/23/18 1632  BP: 105/69 106/62 107/61 127/75  Pulse: 95 98 (!) 105 100  Resp: 16  16 16   Temp:   98 F (36.7 C)   TempSrc:   Oral   Weight:      Height:        Dilation: 4 Effacement (%): 50 Cervical Position: Anterior Station: -3 Presentation: Vertex Exam by:: (Dr. Dareen PianoAnderson) FHT: baseline rate 150, moderate varibility, 10x10, 15x15 acel, no decel Toco: 1-3 minutes for 50-90sec, mild  Labs: Lab Results  Component Value Date   WBC 12.2 (H) 01/23/2018   HGB 11.4 (L) 01/23/2018   HCT 35.6 (L) 01/23/2018   MCV 90.4 01/23/2018   PLT 218 01/23/2018    Patient Active Problem List   Diagnosis Date Noted  . Gestational diabetes, diet controlled 01/23/2018  . GDM (gestational diabetes mellitus) 11/06/2017  . Language barrier 09/19/2017  . Pregnancy with poor obstetric history   . Supervision of high risk pregnancy, antepartum 08/09/2017  . History of low birth weight 08/09/2017  . History of gestational hypertension 08/09/2017    Assessment / Plan: 27 y.o. G2P1001 at 7744w0d here for IOL for post dates and A1DM. Patient progressed to 4cm 50% and soft cervix. Regular contractions. Will recheck again in about 4 hours for possible AROM.  Labor: initiate pitocin 2x2 at this time Fetal Wellbeing:  Category I Pain Control:  Undecided- recommended patient contact us when she is requesting pain medication and makes a decision Anticipated MOD:  vaginal  Marci Polito DO 01/23/2018, 4:48 PM

## 2018-01-24 ENCOUNTER — Encounter (HOSPITAL_COMMUNITY): Payer: Self-pay

## 2018-01-24 MED ORDER — INFLUENZA VAC SPLIT QUAD 0.5 ML IM SUSY: 0.5000 mL | PREFILLED_SYRINGE | INTRAMUSCULAR | Status: DC

## 2018-01-24 MED ORDER — PNEUMOCOCCAL VAC POLYVALENT 25 MCG/0.5ML IJ INJ
0.5000 mL | INJECTION | INTRAMUSCULAR | Status: AC
  Administered 2018-01-25: 0.5 mL via INTRAMUSCULAR
  Filled 2018-01-24: qty 0.5

## 2018-01-24 NOTE — Progress Notes (Signed)
POSTPARTUM PROGRESS NOTE  Post Partum Day 1  Subjective:  Kerry Turner is a 27 y.o. [id number] s/p SVD at 794w0d following IOL for A1GDM.  She reports she is doing well. No acute events overnight. She denies any problems with ambulating, voiding or po intake. Denies nausea or vomiting.  Pain is well controlled.  Lochia is stable and slowing compared to prior. No SOB, dizziness, HA, lightheadedness, or calf tenderness. Interpreter was used for this encounter.  Objective: Blood pressure 107/66, pulse 99, temperature 98 F (36.7 C), temperature source Oral, resp. rate 16, height 4\' 11"  (1.499 m), weight 71.4 kg, last menstrual period 04/18/2017, SpO2 99 %, unknown if currently breastfeeding.  Physical Exam:  General: alert, cooperative and no distress Chest: no respiratory distress Heart:regular rate, distal pulses intact Abdomen: soft, nontender,  Uterine Fundus: firm, appropriately tender DVT Evaluation: No calf swelling or tenderness Extremities: No LE edema Skin: warm, dry  Recent Labs    01/23/18 0800  HGB 11.4*  HCT 35.6*    Assessment/Plan: Kerry Turner is a 27 y.o. [id number] s/p SVD at 464w0d following IOL for A1GDM   PPD#1 - Doing well -- Routine postpartum care  -- Recheck blood glucose this AM  Contraception: Nexplanon Feeding: Breast and bottle Dispo: Plan for discharge tomorrow   LOS: 1 day   Raul DelFrancie Khyleigh Furney, MS3 01/24/2018, 7:15 AM

## 2018-01-24 NOTE — Lactation Note (Signed)
This note was copied from a baby's chart. Lactation Consultation Note  Patient Name: Girl Karie SodaDevi Zumbro ZOXWR'UToday's Date: 01/24/2018 Reason for consult: Initial assessment  Visited with P2 Mom of term baby at 517 hrs old.  Mom explained that she wasn't wanting to breastfeed, but intends to exclusively formula feed her baby.    Consult Status Consult Status: Complete    Judee ClaraSmith, Arti Trang E 01/24/2018, 1:33 PM

## 2018-01-25 MED ORDER — IBUPROFEN 600 MG PO TABS: 600.0000 mg | ORAL_TABLET | Freq: Four times a day (QID) | ORAL | 0 refills | Status: DC

## 2018-01-25 NOTE — Discharge Instructions (Signed)

## 2018-01-25 NOTE — Discharge Summary (Addendum)
Seen with over the phone [place] interpreter   Postpartum Discharge Summary     Patient Name: Kerry Turner DOB: 11/06/1990 MRN: [id number]  Date of admission: 01/23/2018 Delivering Provider: Tamera Stands   Date of discharge: 01/25/2018  Admitting diagnosis: 39wks induction Intrauterine pregnancy: [redacted]w[redacted]d     Secondary diagnosis:  Principal Problem:   Gestational diabetes, diet controlled Active Problems:   History of low birth weight   History of gestational hypertension   Language barrier  Additional problems:  - A1GDM - Hx of low birth weight - Hx of gHTN     Discharge diagnosis: Term Pregnancy Delivered and GDM A1                                                                                                Post partum procedures:none  Augmentation: Pitocin and Cytotec  Complications: None  Hospital course:  Induction of Labor With Vaginal Delivery   27 y.o. yo G2P2002 at [redacted]w[redacted]d was admitted to the hospital 01/23/2018 for induction of labor.  Indication for induction: A1 DM.  Patient had an uncomplicated labor course as follows: Membrane Rupture Time/Date: 7:02 PM ,01/23/2018   Intrapartum Procedures: Episiotomy: None [1]                                         Lacerations:  Perineal [11];1st degree [2]  Patient had delivery of a Viable infant.  Information for the patient's newborn:  Kerry, Turner [[id number]]  Delivery Method: Vag-Spont(filed from delivery)   01/23/2018  Details of delivery can be found in separate delivery note.  Patient had a routine postpartum course. Patient is discharged home 01/25/18.  Magnesium Sulfate recieved: No BMZ received: No  Physical exam  Vitals:   01/24/18 1503 01/24/18 1504 01/24/18 2227 01/25/18 0506  BP: (!) 102/58 (!) 103/58 102/72 107/77  Pulse: 99 98 87 93  Resp: 18  18 20   Temp: 98.3 F (36.8 C)  97.9 F (36.6 C) 98.3 F (36.8 C)  TempSrc: Oral  Oral Oral  SpO2: 100% 100% 100% 100%  Weight:      Height:        General: alert, cooperative and no distress Lochia: appropriate Uterine Fundus: firm Incision: N/A DVT Evaluation: No evidence of DVT seen on physical exam. No significant calf/ankle edema. Labs: Lab Results  Component Value Date   WBC 12.2 (H) 01/23/2018   HGB 11.4 (L) 01/23/2018   HCT 35.6 (L) 01/23/2018   MCV 90.4 01/23/2018   PLT 218 01/23/2018   CMP Latest Ref Rng & Units 08/09/2017  Glucose 65 - 99 mg/dL 93  BUN 6 - 20 mg/dL 4(L)  Creatinine 7.82 - 1.00 mg/dL 9.56(O)  Sodium 130 - 865 mmol/L 139  Potassium 3.5 - 5.2 mmol/L 3.6  Chloride 96 - 106 mmol/L 106  CO2 20 - 29 mmol/L 21  Calcium 8.7 - 10.2 mg/dL 9.3  Total Protein 6.0 - 8.5 g/dL 6.3  Total Bilirubin 0.0 - 1.2 mg/dL 0.3  Alkaline Phos  39 - 117 IU/L 60  AST 0 - 40 IU/L 12  ALT 0 - 32 IU/L 13    Discharge instruction: per After Visit Summary and "Baby and Me Booklet".  After visit meds:  Allergies as of 01/25/2018   No Known Allergies     Medication List    STOP taking these medications   aspirin EC 81 MG tablet     TAKE these medications   ACCU-CHEK FASTCLIX LANCETS Misc 1 Device by Percutaneous route 4 (four) times daily.   glucose blood test strip Use as instructed QID   ibuprofen 600 MG tablet Commonly known as:  ADVIL,MOTRIN Take 1 tablet (600 mg total) by mouth every 6 (six) hours.   PREPLUS 27-1 MG Tabs Take 1 tablet by mouth daily.       Diet: routine diet  Activity: Advance as tolerated. Pelvic rest for 6 weeks.   Outpatient follow up:6 weeks Follow up Appt: Future Appointments  Date Time Provider Department Center  03/05/2018  8:15 AM Gwenevere AbbotPhillip, Nimeka, [facility]  03/05/2018  8:50 AM [facility]   Follow up Visit:   Please schedule this patient for Postpartum visit in:4-6 weeks with the following provider: Any provider For C/S patients schedule nurse incision check in weeks 2 weeks: no Low risk pregnancy complicated by: GDM Delivery mode:   SVD Anticipated Birth Control: depo for 3 months and then Nexplanon PP Procedures needed: 2 hour GTT  Schedule Integrated BH visit: no   Newborn Data: Live born female  Birth Weight: 6 lb 13.4 oz (3100 g) APGAR: 9, 9  Newborn Delivery   Birth date/time:  01/23/2018 20:06:00 Delivery type:  Vaginal, Spontaneous     Baby Feeding: Bottle Disposition:home with mother   01/25/2018 [place] Shirley, DO PGY-2, [facility]   OB FELLOW DISCHARGE ATTESTATION  I have seen and examined this patient and agree with above documentation in the resident's note.   Marcy Sirenatherine Jylan Loeza, D.O. OB Fellow  01/25/2018, 8:47 AM

## 2018-03-04 ENCOUNTER — Other Ambulatory Visit: Payer: Self-pay | Admitting: *Deleted

## 2018-03-04 DIAGNOSIS — O24429 Gestational diabetes mellitus in childbirth, unspecified control: Secondary | ICD-10-CM

## 2018-03-05 ENCOUNTER — Ambulatory Visit: Payer: Medicaid Other | Admitting: Family Medicine

## 2018-03-05 ENCOUNTER — Other Ambulatory Visit: Payer: Medicaid Other

## 2018-03-08 ENCOUNTER — Ambulatory Visit (HOSPITAL_COMMUNITY)
Admission: EM | Admit: 2018-03-08 | Discharge: 2018-03-08 | Disposition: A | Discharge status: Home / Self Care | Payer: Medicaid Other | Attending: Family Medicine | Admitting: Family Medicine

## 2018-03-08 ENCOUNTER — Encounter (HOSPITAL_COMMUNITY): Payer: Self-pay

## 2018-03-08 ENCOUNTER — Other Ambulatory Visit: Payer: Self-pay

## 2018-03-08 DIAGNOSIS — B349 Viral infection, unspecified: Secondary | ICD-10-CM

## 2018-03-08 LAB — POCT RAPID STREP A: Streptococcus, Group A Screen (Direct): NEGATIVE

## 2018-03-08 MED ORDER — BENZONATATE 200 MG PO CAPS: 200.0000 mg | ORAL_CAPSULE | Freq: Two times a day (BID) | ORAL | 0 refills | Status: DC | PRN

## 2018-03-08 MED ORDER — ONDANSETRON HCL 4 MG PO TABS: 4.0000 mg | ORAL_TABLET | Freq: Four times a day (QID) | ORAL | 0 refills | Status: DC

## 2018-03-08 MED ORDER — IBUPROFEN 800 MG PO TABS: 800.0000 mg | ORAL_TABLET | Freq: Three times a day (TID) | ORAL | 0 refills | Status: DC

## 2018-03-08 NOTE — ED Triage Notes (Signed)
Pt cc cough and sore  throat. X 3 days

## 2018-03-08 NOTE — ED Provider Notes (Signed)
[facility]    CSN: [id number] Arrival date & time: 03/08/18  1853     History   Chief Complaint Chief Complaint  Patient presents with  . Fever    HPI Kerry Turner is a 28 y.o. female.   HPI Patient has had cough and sore throat runny nose for 3 days.  Temperature to 101.  Unknown exposure to flu.  She is taken some Tylenol at home.  She has a 665-week-old baby at home.  The baby was sick also.  Baby was treated with ibuprofen and Tylenol.  She states her husband also had a virus.  He was treated with "medicine".  She does not know the name.  Does not recognize the terms influenza, or Tamiflu.  She is bottlefeeding her baby Past Medical History:  Diagnosis Date  . Diabetes mellitus without complication (HCC)   . Pregnancy induced hypertension   . Vaginal Pap smear, abnormal     Patient Active Problem List   Diagnosis Date Noted  . Gestational diabetes, diet controlled 01/23/2018  . GDM (gestational diabetes mellitus) 11/06/2017  . Language barrier 09/19/2017  . Pregnancy with poor obstetric history   . Supervision of high risk pregnancy, antepartum 08/09/2017  . History of low birth weight 08/09/2017  . History of gestational hypertension 08/09/2017    Past Surgical History:  Procedure Laterality Date  . BREAST SURGERY     had surgery on a bone under the breast    OB History    Gravida  2   Para  2   Term  2   Preterm  0   AB  0   Living  2     SAB  0   TAB  0   Ectopic  0   Multiple  0   Live Births  2            Home Medications    Prior to Admission medications   Medication Sig Start Date End Date Taking? Authorizing Provider  ACCU-CHEK FASTCLIX LANCETS MISC 1 Device by Percutaneous route 4 (four) times daily. 11/12/17   [place] BingPickens, Charlie, MD  benzonatate (TESSALON) 200 MG capsule Take 1 capsule (200 mg total) by mouth 2 (two) times daily as needed for cough. 03/08/18   Eustace MooreNelson, Mrk Buzby Sue, MD  glucose blood (ACCU-CHEK GUIDE) test  strip Use as instructed QID 11/12/17   [place] BingPickens, Charlie, MD  ibuprofen (ADVIL,MOTRIN) 800 MG tablet Take 1 tablet (800 mg total) by mouth 3 (three) times daily. Take with food 03/08/18   Eustace MooreNelson, Aileena Iglesia Sue, MD  ondansetron (ZOFRAN) 4 MG tablet Take 1 tablet (4 mg total) by mouth every 6 (six) hours. 03/08/18   Eustace MooreNelson, Retaj Hilbun Sue, MD    Family History Family History  Problem Relation Age of Onset  . Hypertension Mother     Social History Social History   Tobacco Use  . Smoking status: Never Smoker  . Smokeless tobacco: Never Used  Substance Use Topics  . Alcohol use: Never    Frequency: Never  . Drug use: Never     Allergies   Patient has no known allergies.   Review of Systems Review of Systems  Constitutional: Positive for fatigue and fever. Negative for chills.  HENT: Positive for congestion. Negative for ear pain and sore throat.   Eyes: Negative for pain and visual disturbance.  Respiratory: Positive for cough. Negative for shortness of breath.   Cardiovascular: Negative for chest pain and palpitations.  Gastrointestinal: Positive for  nausea. Negative for abdominal pain and vomiting.  Genitourinary: Negative for dysuria and hematuria.  Musculoskeletal: Negative for arthralgias and back pain.  Skin: Negative for color change and rash.  Neurological: Negative for seizures and syncope.  All other systems reviewed and are negative.    Physical Exam Triage Vital Signs ED Triage Vitals  Enc Vitals Group     BP 03/08/18 1931 118/74     Pulse Rate 03/08/18 1931 90     Resp 03/08/18 1931 18     Temp 03/08/18 1931 98.3 F (36.8 C)     Temp Source 03/08/18 1931 Oral     SpO2 03/08/18 1931 100 %     Weight 03/08/18 1929 143 lb (64.9 kg)     Height --      Head Circumference --      Peak Flow --      Pain Score 03/08/18 1929 9     Pain Loc --      Pain Edu? --      Excl. in GC? --    No data found.  Updated Vital Signs BP 118/74 (BP Location: Right Arm)   Pulse 90    Temp 98.3 F (36.8 C) (Oral)   Resp 18   Wt 64.9 kg   LMP 04/18/2017 (Exact Date)   SpO2 100%   BMI 28.88 kg/m   Visual Acuity Right Eye Distance:   Left Eye Distance:   Bilateral Distance:    Right Eye Near:   Left Eye Near:    Bilateral Near:     Physical Exam Constitutional:      General: She is not in acute distress.    Appearance: She is well-developed.  HENT:     Head: Normocephalic and atraumatic.     Right Ear: Tympanic membrane, ear canal and external ear normal.     Left Ear: Tympanic membrane, ear canal and external ear normal.     Nose: Rhinorrhea present.     Comments: Clear rhinorrhea    Mouth/Throat:     Comments: Tonsils are enlarged.  No exudate.  Mild erythema. Eyes:     Conjunctiva/sclera: Conjunctivae normal.     Pupils: Pupils are equal, round, and reactive to light.  Neck:     Musculoskeletal: Normal range of motion.  Cardiovascular:     Rate and Rhythm: Normal rate and regular rhythm.     Heart sounds: Normal heart sounds.  Pulmonary:     Effort: Pulmonary effort is normal. No respiratory distress.     Comments: Clear lungs Abdominal:     General: There is no distension.     Palpations: Abdomen is soft.     Tenderness: There is no abdominal tenderness.  Musculoskeletal: Normal range of motion.  Lymphadenopathy:     Cervical: Cervical adenopathy present.  Skin:    General: Skin is warm and dry.  Neurological:     General: No focal deficit present.     Mental Status: She is alert.  Psychiatric:        Mood and Affect: Mood normal.      UC Treatments / Results  Labs (all labs ordered are listed, but only abnormal results are displayed) Labs Reviewed  CULTURE, GROUP A STREP [facility])  POCT RAPID STREP A  Strep is negative  EKG None  Radiology No results found.  Procedures Procedures (including critical care time)  Medications Ordered in UC Medications - No data to display  Initial Impression / Assessment and Plan /  UC  Course  I have reviewed the triage vital signs and the nursing notes.  Pertinent labs & imaging results that were available during my care of the patient were reviewed by me and considered in my medical decision making (see chart for details).     Discussed viral illness.  Symptomatic care.  No need for antibiotics.  Return if worse Final Clinical Impressions(s) / UC Diagnoses   Final diagnoses:  Viral illness     Discharge Instructions     Drink plenty of fluids take ibuprofen for pain and fever and body aches May use tessalon for cough This will take a few days to improve Return if worse    ED Prescriptions    Medication Sig Dispense Auth. Provider   ondansetron (ZOFRAN) 4 MG tablet Take 1 tablet (4 mg total) by mouth every 6 (six) hours. 12 tablet Eustace Moore, MD   benzonatate (TESSALON) 200 MG capsule Take 1 capsule (200 mg total) by mouth 2 (two) times daily as needed for cough. 20 capsule Eustace Moore, MD   ibuprofen (ADVIL,MOTRIN) 800 MG tablet Take 1 tablet (800 mg total) by mouth 3 (three) times daily. Take with food 21 tablet Eustace Moore, MD     Controlled Substance Prescriptions  Controlled Substance Registry consulted? Not Applicable   Eustace Moore, MD 03/08/18 2006

## 2018-03-08 NOTE — Discharge Instructions (Signed)
Drink plenty of fluids take ibuprofen for pain and fever and body aches May use tessalon for cough This will take a few days to improve Return if worse

## 2018-03-11 LAB — CULTURE, GROUP A STREP (THRC)

## 2018-03-12 ENCOUNTER — Encounter: Payer: Self-pay | Admitting: *Deleted

## 2018-09-19 ENCOUNTER — Other Ambulatory Visit: Payer: Self-pay | Admitting: Internal Medicine

## 2018-09-19 DIAGNOSIS — Z20822 Contact with and (suspected) exposure to covid-19: Secondary | ICD-10-CM

## 2018-09-24 LAB — NOVEL CORONAVIRUS, NAA: SARS-CoV-2, NAA: NOT DETECTED

## 2018-09-27 ENCOUNTER — Telehealth: Payer: Self-pay | Admitting: General Practice

## 2018-09-27 NOTE — Telephone Encounter (Signed)
Patient results mailed

## 2018-10-03 ENCOUNTER — Ambulatory Visit (HOSPITAL_COMMUNITY)
Admission: EM | Admit: 2018-10-03 | Discharge: 2018-10-03 | Disposition: A | Discharge status: Home / Self Care | Payer: Medicaid Other | Attending: Family Medicine | Admitting: Family Medicine

## 2018-10-03 ENCOUNTER — Other Ambulatory Visit: Payer: Self-pay

## 2018-10-03 DIAGNOSIS — R05 Cough: Secondary | ICD-10-CM | POA: Diagnosis not present

## 2018-10-03 DIAGNOSIS — R059 Cough, unspecified: Secondary | ICD-10-CM

## 2018-10-03 DIAGNOSIS — J029 Acute pharyngitis, unspecified: Secondary | ICD-10-CM | POA: Diagnosis not present

## 2018-10-03 DIAGNOSIS — R509 Fever, unspecified: Secondary | ICD-10-CM

## 2018-10-03 DIAGNOSIS — E119 Type 2 diabetes mellitus without complications: Secondary | ICD-10-CM | POA: Insufficient documentation

## 2018-10-03 DIAGNOSIS — Z20828 Contact with and (suspected) exposure to other viral communicable diseases: Secondary | ICD-10-CM | POA: Diagnosis not present

## 2018-10-03 DIAGNOSIS — Z20822 Contact with and (suspected) exposure to covid-19: Secondary | ICD-10-CM

## 2018-10-03 NOTE — Discharge Instructions (Addendum)
Drink plenty of fluids Take tylenol for pain or fever Stay home in quarantine until covid test result.  CALL for questions or problems with work notes     Person Under Monitoring Name: Kerry Turner  Location: [place] [address]   Infection Prevention Recommendations for Individuals Confirmed to have, or Being Evaluated for, 2019 Novel Coronavirus (COVID-19) Infection Who Receive Care at Home  Individuals who are confirmed to have, or are being evaluated for, COVID-19 should follow the prevention steps below until a healthcare provider or local or state health department says they can return to normal activities.  Stay home except to get medical care You should restrict activities outside your home, except for getting medical care. Do not go to work, school, or public areas, and do not use public transportation or taxis.  Call ahead before visiting your doctor Before your medical appointment, call the healthcare provider and tell them that you have, or are being evaluated for, COVID-19 infection. This will help the healthcare providers office take steps to keep other people from getting infected. Ask your healthcare provider to call the local or state health department.  Monitor your symptoms Seek prompt medical attention if your illness is worsening (e.g., difficulty breathing). Before going to your medical appointment, call the healthcare provider and tell them that you have, or are being evaluated for, COVID-19 infection. Ask your healthcare provider to call the local or state health department.  Wear a facemask You should wear a facemask that covers your nose and mouth when you are in the same room with other people and when you visit a healthcare provider. People who live with or visit you should also wear a facemask while they are in the same room with you.  Separate yourself from other people in your home As much as possible, you should stay  in a different room from other people in your home. Also, you should use a separate bathroom, if available.  Avoid sharing household items You should not share dishes, drinking glasses, cups, eating utensils, towels, bedding, or other items with other people in your home. After using these items, you should wash them thoroughly with soap and water.  Cover your coughs and sneezes Cover your mouth and nose with a tissue when you cough or sneeze, or you can cough or sneeze into your sleeve. Throw used tissues in a lined trash can, and immediately wash your hands with soap and water for at least 20 seconds or use an alcohol-based hand rub.  Wash your [employer] your hands often and thoroughly with soap and water for at least 20 seconds. You can use an alcohol-based hand sanitizer if soap and water are not available and if your hands are not visibly dirty. Avoid touching your eyes, nose, and mouth with unwashed hands.   Prevention Steps for Caregivers and Household Members of Individuals Confirmed to have, or Being Evaluated for, COVID-19 Infection Being Cared for in the Home  If you live with, or provide care at home for, a person confirmed to have, or being evaluated for, COVID-19 infection please follow these guidelines to prevent infection:  Follow healthcare providers instructions Make sure that you understand and can help the patient follow any healthcare provider instructions for all care.  Provide for the patients basic needs You should help the patient with basic needs in the home and provide support for getting groceries, prescriptions, and other personal needs.  Monitor the patients symptoms If  they are getting sicker, call his or her medical provider and tell them that the patient has, or is being evaluated for, COVID-19 infection. This will help the healthcare providers office take steps to keep other people from getting infected. Ask the healthcare provider to call the  local or state health department.  Limit the number of people who have contact with the patient If possible, have only one caregiver for the patient. Other household members should stay in another home or place of residence. If this is not possible, they should stay in another room, or be separated from the patient as much as possible. Use a separate bathroom, if available. Restrict visitors who do not have an essential need to be in the home.  Keep older adults, very young children, and other sick people away from the patient Keep older adults, very young children, and those who have compromised immune systems or chronic health conditions away from the patient. This includes people with chronic heart, lung, or kidney conditions, diabetes, and cancer.  Ensure good ventilation Make sure that shared spaces in the home have good air flow, such as from an air conditioner or an opened window, weather permitting.  Wash your hands often Wash your hands often and thoroughly with soap and water for at least 20 seconds. You can use an alcohol based hand sanitizer if soap and water are not available and if your hands are not visibly dirty. Avoid touching your eyes, nose, and mouth with unwashed hands. Use disposable paper towels to dry your hands. If not available, use dedicated cloth towels and replace them when they become wet.  Wear a facemask and gloves Wear a disposable facemask at all times in the room and gloves when you touch or have contact with the patients blood, body fluids, and/or secretions or excretions, such as sweat, saliva, sputum, nasal mucus, vomit, urine, or feces.  Ensure the mask fits over your nose and mouth tightly, and do not touch it during use. Throw out disposable facemasks and gloves after using them. Do not reuse. Wash your hands immediately after removing your facemask and gloves. If your personal clothing becomes contaminated, carefully remove clothing and launder. Wash  your hands after handling contaminated clothing. Place all used disposable facemasks, gloves, and other waste in a lined container before disposing them with other household waste. Remove gloves and wash your hands immediately after handling these items.  Do not share dishes, glasses, or other household items with the patient Avoid sharing household items. You should not share dishes, drinking glasses, cups, eating utensils, towels, bedding, or other items with a patient who is confirmed to have, or being evaluated for, COVID-19 infection. After the person uses these items, you should wash them thoroughly with soap and water.  Wash laundry thoroughly Immediately remove and wash clothes or bedding that have blood, body fluids, and/or secretions or excretions, such as sweat, saliva, sputum, nasal mucus, vomit, urine, or feces, on them. Wear gloves when handling laundry from the patient. Read and follow directions on labels of laundry or clothing items and detergent. In general, wash and dry with the warmest temperatures recommended on the label.  Clean all areas the individual has used often Clean all touchable surfaces, such as counters, tabletops, doorknobs, bathroom fixtures, toilets, phones, keyboards, tablets, and bedside tables, every day. Also, clean any surfaces that may have blood, body fluids, and/or secretions or excretions on them. Wear gloves when cleaning surfaces the patient has come in contact with. Use  a diluted bleach solution (e.g., dilute bleach with 1 part bleach and 10 parts water) or a household disinfectant with a label that says EPA-registered for coronaviruses. To make a bleach solution at home, add 1 tablespoon of bleach to 1 quart (4 cups) of water. For a larger supply, add  cup of bleach to 1 gallon (16 cups) of water. Read labels of cleaning products and follow recommendations provided on product labels. Labels contain instructions for safe and effective use of the  cleaning product including precautions you should take when applying the product, such as wearing gloves or eye protection and making sure you have good ventilation during use of the product. Remove gloves and wash hands immediately after cleaning.  Monitor yourself for signs and symptoms of illness Caregivers and household members are considered close contacts, should monitor their health, and will be asked to limit movement outside of the home to the extent possible. Follow the monitoring steps for close contacts listed on the symptom monitoring form.   ? If you have additional questions, contact your local health department or call the epidemiologist on call at [phone] (available 24/7). ? This guidance is subject to change. For the most up-to-date guidance from [facility], please refer to their website: [email or web address]

## 2018-10-03 NOTE — ED Provider Notes (Signed)
[facility]    CSN: [id number] Arrival date & time: 10/03/18  1326      History   Chief Complaint Chief Complaint  Patient presents with  . Cough  . Fever    HPI Kerry Turner is a 28 y.o. female.   HPI  Patient is seen with the Nepali interpreter In spite of this there is still a mild language difficulty Apparently she states her sister-in-law had a coag positive test.  She was around her 10 to 12 days ago.  For 3 days she has had cough and fever.  She has not taken her temperature.  No shortness of breath.  She has some sore throat.  She states her tongue is sore.  No change in taste or smell.  No nausea vomiting diarrhea.  She has not taken any medication for symptoms.  She is trying to keep up with her fluids.  She has 2 small children at home. Patient had coronavirus testing 14 days ago that was negative. Past Medical History:  Diagnosis Date  . Diabetes mellitus without complication (HCC)   . Pregnancy induced hypertension   . Vaginal Pap smear, abnormal     Patient Active Problem List   Diagnosis Date Noted  . Gestational diabetes, diet controlled 01/23/2018  . GDM (gestational diabetes mellitus) 11/06/2017  . Language barrier 09/19/2017  . Pregnancy with poor obstetric history   . Supervision of high risk pregnancy, antepartum 08/09/2017  . History of low birth weight 08/09/2017  . History of gestational hypertension 08/09/2017    Past Surgical History:  Procedure Laterality Date  . BREAST SURGERY     had surgery on a bone under the breast    OB History    Gravida  2   Para  2   Term  2   Preterm  0   AB  0   Living  2     SAB  0   TAB  0   Ectopic  0   Multiple  0   Live Births  2            Home Medications    Prior to Admission medications   Not on File    Family History Family History  Problem Relation Age of Onset  . Hypertension Mother     Social History Social History   Tobacco Use  . Smoking status:  Never Smoker  . Smokeless tobacco: Never Used  Substance Use Topics  . Alcohol use: Never    Frequency: Never  . Drug use: Never     Allergies   Patient has no known allergies.   Review of Systems Review of Systems  Constitutional: Positive for fatigue and fever. Negative for chills.  HENT: Positive for sore throat. Negative for ear pain.   Eyes: Negative for pain and visual disturbance.  Respiratory: Positive for cough. Negative for shortness of breath.   Cardiovascular: Positive for chest pain. Negative for palpitations.  Gastrointestinal: Negative for abdominal pain and vomiting.  Genitourinary: Negative for dysuria and hematuria.  Musculoskeletal: Positive for myalgias. Negative for arthralgias and back pain.  Skin: Negative for color change and rash.  Neurological: Negative for seizures and syncope.  All other systems reviewed and are negative.    Physical Exam Triage Vital Signs ED Triage Vitals  Enc Vitals Group     BP 10/03/18 1358 104/70     Pulse Rate 10/03/18 1358 81     Resp 10/03/18 1358 16  Temp 10/03/18 1358 98.3 F (36.8 C)     Temp Source 10/03/18 1358 Temporal     SpO2 10/03/18 1358 100 %     Weight --      Height --      Head Circumference --      Peak Flow --      Pain Score 10/03/18 1401 3     Pain Loc --      Pain Edu? --      Excl. in [place]? --    No data found.  Updated Vital Signs BP 104/70 (BP Location: Left Arm)   Pulse 81   Temp 98.3 F (36.8 C) (Temporal)   Resp 16   SpO2 100%      Physical Exam Vitals signs and nursing note reviewed.  Constitutional:      General: She is not in acute distress.    Appearance: She is well-developed and normal weight.     Comments: Appears tired  HENT:     Head: Normocephalic and atraumatic.     Nose: Nose normal. No rhinorrhea.     Mouth/Throat:     Mouth: Mucous membranes are moist.     Pharynx: Posterior oropharyngeal erythema present.     Comments: Posterior pharynx mildly injected  Eyes:     Conjunctiva/sclera: Conjunctivae normal.     Pupils: Pupils are equal, round, and reactive to light.  Neck:     Musculoskeletal: Normal range of motion and neck supple.  Cardiovascular:     Rate and Rhythm: Normal rate and regular rhythm.     Heart sounds: Normal heart sounds.  Pulmonary:     Effort: Pulmonary effort is normal. No respiratory distress.     Breath sounds: Normal breath sounds. No wheezing, rhonchi or rales.  Abdominal:     General: There is no distension.     Palpations: Abdomen is soft.     Tenderness: There is no abdominal tenderness.  Musculoskeletal: Normal range of motion.  Lymphadenopathy:     Cervical: No cervical adenopathy.  Skin:    General: Skin is warm and dry.  Neurological:     General: No focal deficit present.     Mental Status: She is alert.  Psychiatric:        Mood and Affect: Mood normal.      UC Treatments / Results  Labs (all labs ordered are listed, but only abnormal results are displayed) Labs Reviewed - No data to display  EKG   Radiology No results found.  Procedures Procedures (including critical care time)  Medications Ordered in UC Medications - No data to display  Initial Impression / Assessment and Plan / UC Course  I have reviewed the triage vital signs and the nursing notes.  Pertinent labs & imaging results that were available during my care of the patient were reviewed by me and considered in my medical decision making (see chart for details).     Discussed coronavirus.  The importance of quarantine while waiting for coronavirus test result.  She is given a note for work asking her to be released from duty until her test result is available.  She is told to call for questions or problems. Final Clinical Impressions(s) / UC Diagnoses   Final diagnoses:  Cough  Suspected Covid-19 Virus Infection     Discharge Instructions     Drink plenty of fluids Take tylenol for pain or fever Stay home in  quarantine until covid test result.  CALL for  questions or problems with work notes     Person Under Monitoring Name: Kerry Turner  Location: [address] [place] [id number]   Infection Prevention Recommendations for Individuals Confirmed to have, or Being Evaluated for, 2019 Novel Coronavirus (COVID-19) Infection Who Receive Care at Home  Individuals who are confirmed to have, or are being evaluated for, COVID-19 should follow the prevention steps below until a healthcare provider or local or state health department says they can return to normal activities.  Stay home except to get medical care You should restrict activities outside your home, except for getting medical care. Do not go to work, school, or public areas, and do not use public transportation or taxis.  Call ahead before visiting your doctor Before your medical appointment, call the healthcare provider and tell them that you have, or are being evaluated for, COVID-19 infection. This will help the healthcare provider's office take steps to keep other people from getting infected. Ask your healthcare provider to call the local or state health department.  Monitor your symptoms Seek prompt medical attention if your illness is worsening (e.g., difficulty breathing). Before going to your medical appointment, call the healthcare provider and tell them that you have, or are being evaluated for, COVID-19 infection. Ask your healthcare provider to call the local or state health department.  Wear a facemask You should wear a facemask that covers your nose and mouth when you are in the same room with other people and when you visit a healthcare provider. People who live with or visit you should also wear a facemask while they are in the same room with you.  Separate yourself from other people in your home As much as possible, you should stay in a different room from other people in your home. Also, you should  use a separate bathroom, if available.  Avoid sharing household items You should not share dishes, drinking glasses, cups, eating utensils, towels, bedding, or other items with other people in your home. After using these items, you should wash them thoroughly with soap and water.  Cover your coughs and sneezes Cover your mouth and nose with a tissue when you cough or sneeze, or you can cough or sneeze into your sleeve. Throw used tissues in a lined trash can, and immediately wash your hands with soap and water for at least 20 seconds or use an alcohol-based hand rub.  Wash your [employer] Wash your hands often and thoroughly with soap and water for at least 20 seconds. You can use an alcohol-based hand sanitizer if soap and water are not available and if your hands are not visibly dirty. Avoid touching your eyes, nose, and mouth with unwashed hands.   Prevention Steps for Caregivers and Household Members of Individuals Confirmed to have, or Being Evaluated for, COVID-19 Infection Being Cared for in the Home  If you live with, or provide care at home for, a person confirmed to have, or being evaluated for, COVID-19 infection please follow these guidelines to prevent infection:  Follow healthcare provider's instructions Make sure that you understand and can help the patient follow any healthcare provider instructions for all care.  Provide for the patient's basic needs You should help the patient with basic needs in the home and provide support for getting groceries, prescriptions, and other personal needs.  Monitor the patient's symptoms If they are getting sicker, call his or her medical provider and tell them that the patient has, or is being evaluated  for, COVID-19 infection. This will help the healthcare provider's office take steps to keep other people from getting infected. Ask the healthcare provider to call the local or state health department.  Limit the number of people who  have contact with the patient  If possible, have only one caregiver for the patient.  Other household members should stay in another home or place of residence. If this is not possible, they should stay  in another room, or be separated from the patient as much as possible. Use a separate bathroom, if available.  Restrict visitors who do not have an essential need to be in the home.  Keep older adults, very young children, and other sick people away from the patient Keep older adults, very young children, and those who have compromised immune systems or chronic health conditions away from the patient. This includes people with chronic heart, lung, or kidney conditions, diabetes, and cancer.  Ensure good ventilation Make sure that shared spaces in the home have good air flow, such as from an air conditioner or an opened window, weather permitting.  Wash your hands often  Wash your hands often and thoroughly with soap and water for at least 20 seconds. You can use an alcohol based hand sanitizer if soap and water are not available and if your hands are not visibly dirty.  Avoid touching your eyes, nose, and mouth with unwashed hands.  Use disposable paper towels to dry your hands. If not available, use dedicated cloth towels and replace them when they become wet.  Wear a facemask and gloves  Wear a disposable facemask at all times in the room and gloves when you touch or have contact with the patient's blood, body fluids, and/or secretions or excretions, such as sweat, saliva, sputum, nasal mucus, vomit, urine, or feces.  Ensure the mask fits over your nose and mouth tightly, and do not touch it during use.  Throw out disposable facemasks and gloves after using them. Do not reuse.  Wash your hands immediately after removing your facemask and gloves.  If your personal clothing becomes contaminated, carefully remove clothing and launder. Wash your hands after handling contaminated  clothing.  Place all used disposable facemasks, gloves, and other waste in a lined container before disposing them with other household waste.  Remove gloves and wash your hands immediately after handling these items.  Do not share dishes, glasses, or other household items with the patient  Avoid sharing household items. You should not share dishes, drinking glasses, cups, eating utensils, towels, bedding, or other items with a patient who is confirmed to have, or being evaluated for, COVID-19 infection.  After the person uses these items, you should wash them thoroughly with soap and water.  Wash laundry thoroughly  Immediately remove and wash clothes or bedding that have blood, body fluids, and/or secretions or excretions, such as sweat, saliva, sputum, nasal mucus, vomit, urine, or feces, on them.  Wear gloves when handling laundry from the patient.  Read and follow directions on labels of laundry or clothing items and detergent. In general, wash and dry with the warmest temperatures recommended on the label.  Clean all areas the individual has used often  Clean all touchable surfaces, such as counters, tabletops, doorknobs, bathroom fixtures, toilets, phones, keyboards, tablets, and bedside tables, every day. Also, clean any surfaces that may have blood, body fluids, and/or secretions or excretions on them.  Wear gloves when cleaning surfaces the patient has come in contact with.  Use  a diluted bleach solution (e.g., dilute bleach with 1 part bleach and 10 parts water) or a household disinfectant with a label that says EPA-registered for coronaviruses. To make a bleach solution at home, add 1 tablespoon of bleach to 1 quart (4 cups) of water. For a larger supply, add  cup of bleach to 1 gallon (16 cups) of water.  Read labels of cleaning products and follow recommendations provided on product labels. Labels contain instructions for safe and effective use of the cleaning product  including precautions you should take when applying the product, such as wearing gloves or eye protection and making sure you have good ventilation during use of the product.  Remove gloves and wash hands immediately after cleaning.  Monitor yourself for signs and symptoms of illness Caregivers and household members are considered close contacts, should monitor their health, and will be asked to limit movement outside of the home to the extent possible. Follow the monitoring steps for close contacts listed on the symptom monitoring form.   ? If you have additional questions, contact your local health department or call the epidemiologist on call at [phone] (available 24/7). ? This guidance is subject to change. For the most up-to-date guidance from [facility], please refer to their website: TripMetro.huhttps://www.cdc.gov/coronavirus/2019-ncov/hcp/guidance-prevent-spread.html    ED Prescriptions    None     Controlled Substance Prescriptions [place] Controlled Substance Registry consulted? Not Applicable   Eustace MooreNelson, Nyjah Denio Sue, MD 10/03/18 1435

## 2018-10-03 NOTE — ED Triage Notes (Signed)
Pt here with cough and fever x 3 days

## 2018-10-05 LAB — NOVEL CORONAVIRUS, NAA (HOSP ORDER, SEND-OUT TO REF LAB; TAT 18-24 HRS): SARS-CoV-2, NAA: NOT DETECTED

## 2019-06-23 IMAGING — US US MFM OB DETAIL+14 WK
1 series · 14 of 28 positions shown · non-contrast
Comparison: none

[Series 1: us mfm ob detail+14 wk · 72 acquisitions, 14 frames shown]
[im 3/72]
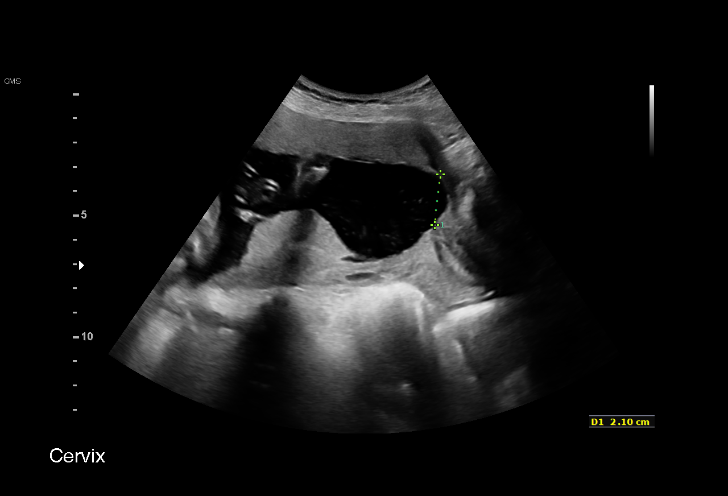
[im 8/72]
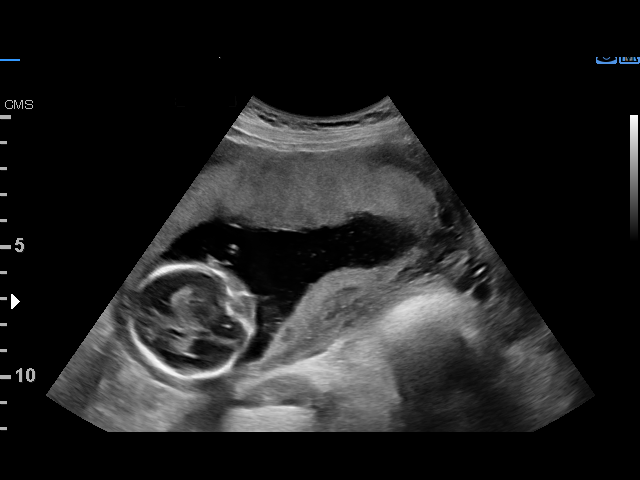
[im 14/72]
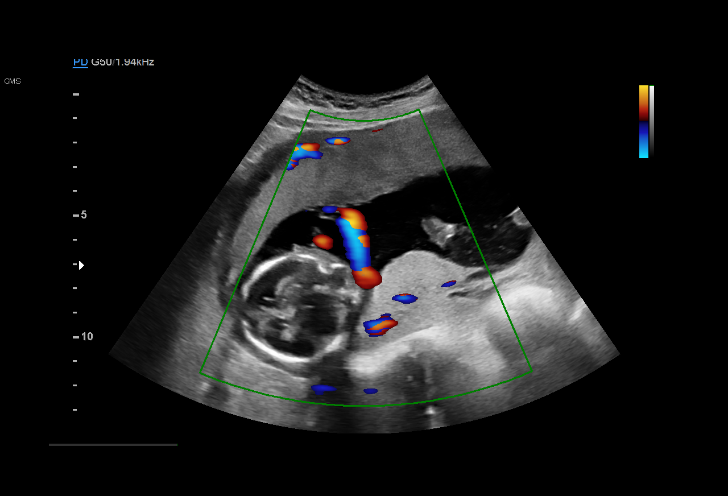
[im 19/72]
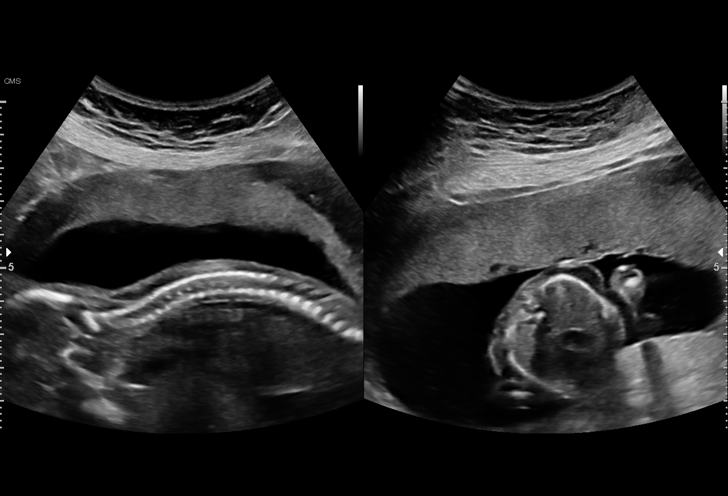
[im 24/72]
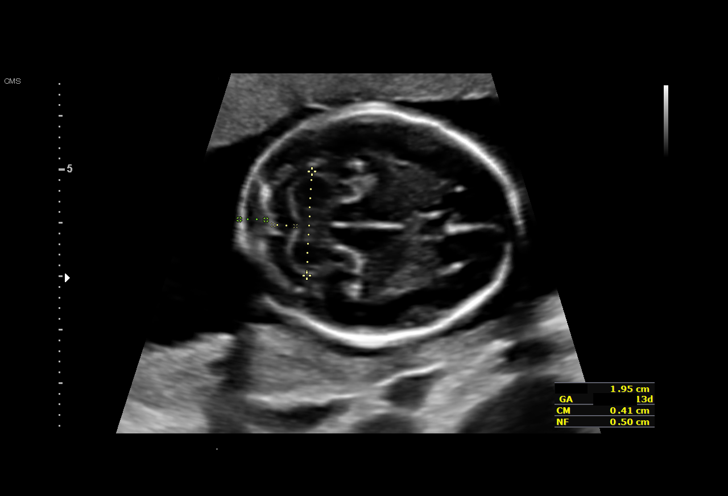
[im 29/72]
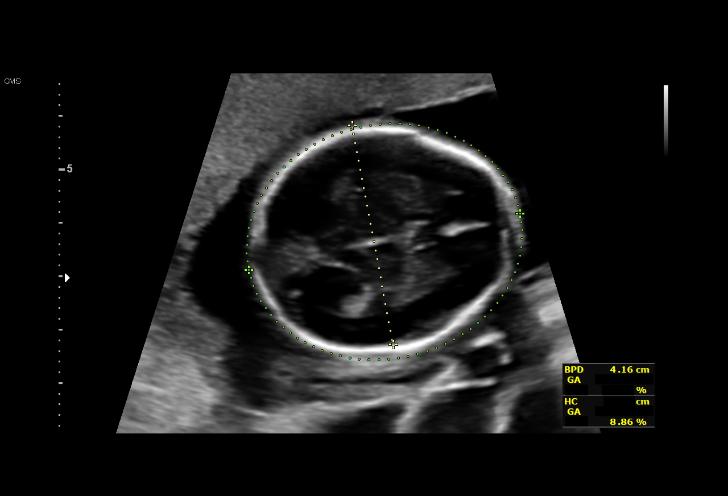
[im 35/72]
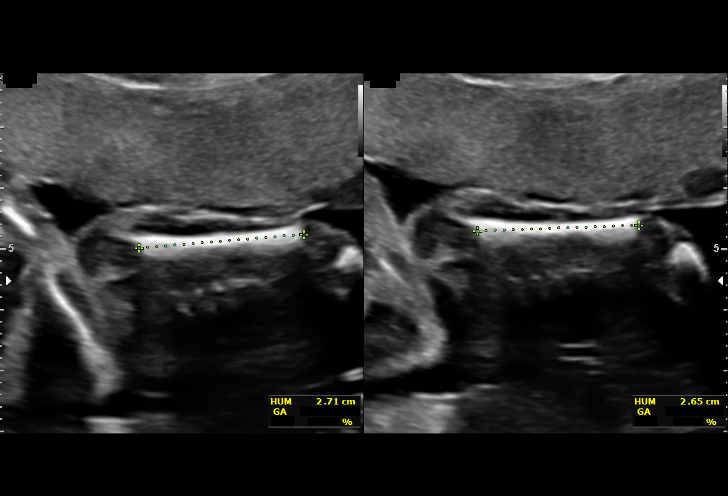
[im 40/72]
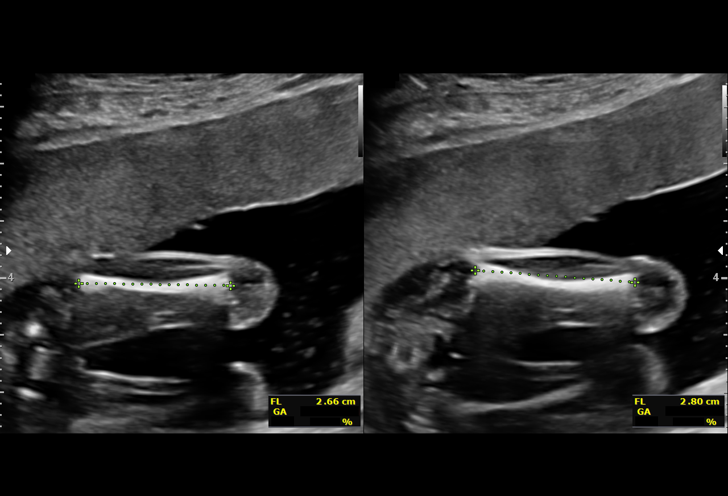
[im 45/72]
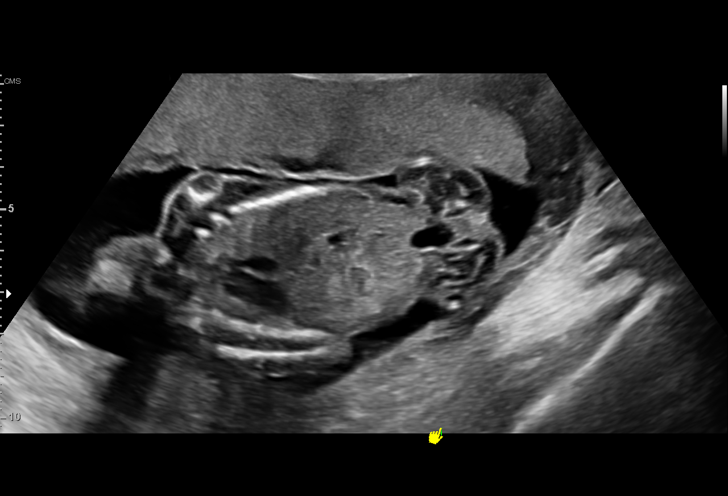
[im 50/72]
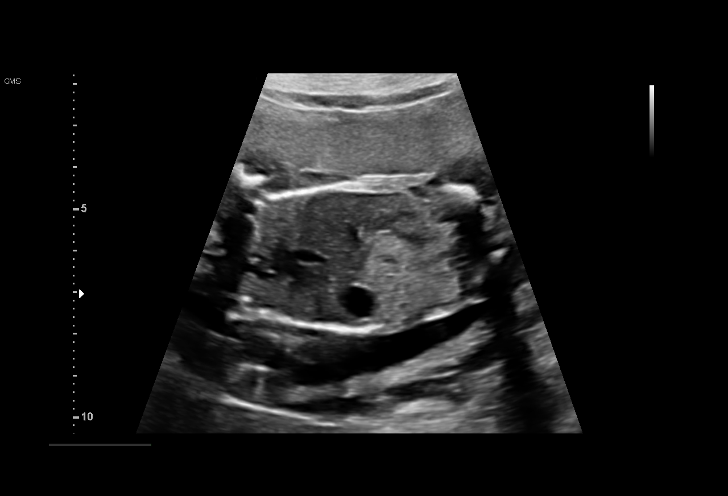
[im 56/72]
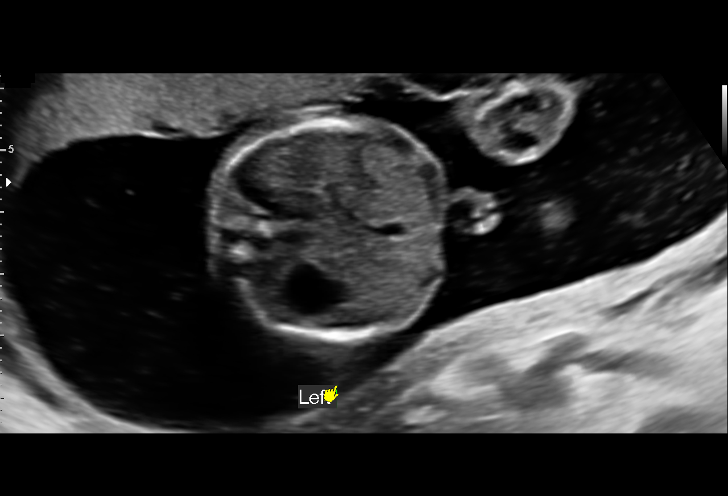
[im 61/72]
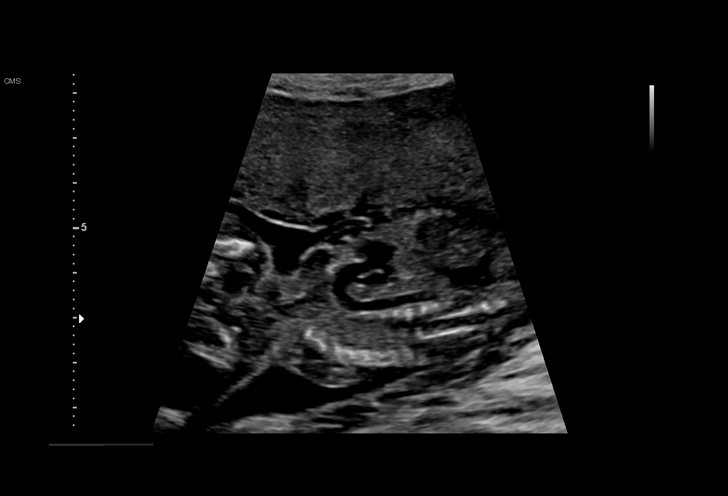
[im 66/72]
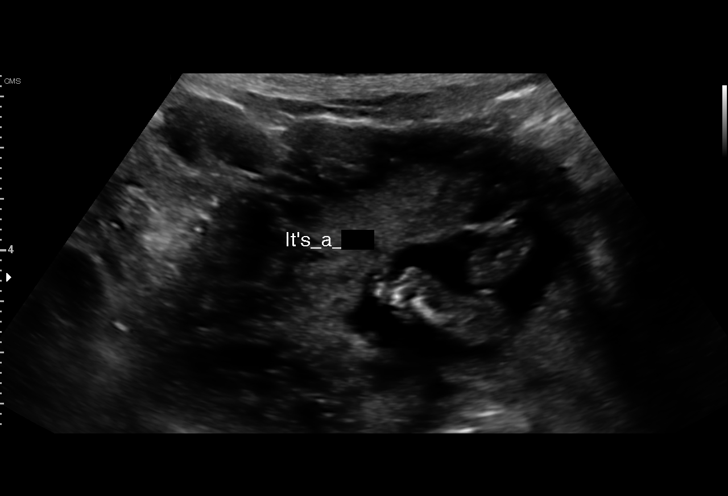
[im 72/72]
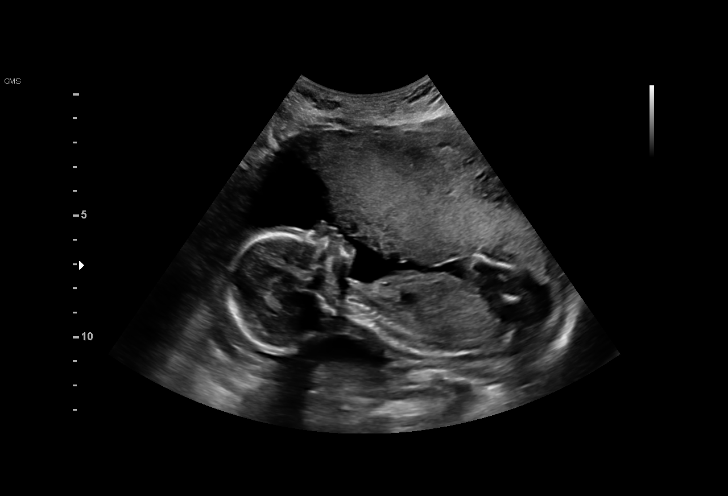

[14 of 28 positions shown; findings below may reference images not displayed]

OB/Gyn Clinic

1  ARE ESTACIO            [id number]      [id number]     [id number]
Indications

19 weeks gestation of pregnancy
Encounter for antenatal screening for
malformations
Poor obstetric history: Previous fetal growth
restriction (FGR/GHTN) 1lb0oz
OB History

Gravidity:    2         Term:   1        Prem:   0        SAB:   0
TOP:          0       Ectopic:  0        Living: 1
Fetal Evaluation

Num Of Fetuses:     1
Fetal Heart         150
Rate(bpm):
Cardiac Activity:   Observed
Presentation:       Breech
Placenta:           Anterior, above cervical os
P. Cord Insertion:  Visualized

Amniotic Fluid
AFI FV:      Subjectively within normal limits

Largest Pocket(cm)
5.32
Biometry

BPD:      41.3  mm     G. Age:  18w 3d         29  %    CI:        72.97   %    70 - 86
FL/HC:      17.8   %    16.1 -
HC:      153.7  mm     G. Age:  18w 3d         16  %    HC/AC:      1.22        1.09 -
AC:      126.2  mm     G. Age:  18w 2d         22  %    FL/BPD:     66.1   %
FL:       27.3  mm     G. Age:  18w 2d         22  %    FL/AC:      21.6   %    20 - 24
HUM:      26.8  mm     G. Age:  18w 4d         37  %
CER:      19.5  mm     G. Age:  18w 5d         43  %
NFT:         5  mm
CM:        4.1  mm

Est. FW:     233  gm      0 lb 8 oz     31  %
Gestational Age

LMP:           19w 0d        Date:  04/18/17                 EDD:   01/23/18
U/S Today:     18w 3d                                        EDD:   01/27/18
Best:          19w 0d     Det. By:  LMP  (04/18/17)          EDD:   01/23/18
Anatomy

Cranium:               Appears normal         Aortic Arch:            Appears normal
Cavum:                 Appears normal         Ductal Arch:            Appears normal
Ventricles:            Appears normal         Diaphragm:              Appears normal
Choroid Plexus:        Appears normal         Stomach:                Appears normal, left
sided
Cerebellum:            Appears normal         Abdomen:                Appears normal
Posterior Fossa:       Appears normal         Abdominal Wall:         Appears nml (cord
insert, abd wall)
Nuchal Fold:           Appears normal         Cord Vessels:           Appears normal (3
vessel cord)
Face:                  Appears normal         Kidneys:                Appear normal
(orbits and profile)
Lips:                  Appears normal         Bladder:                Appears normal
Thoracic:              Appears normal         Spine:                  Appears normal
Heart:                 Appears normal         Upper Extremities:      Appears normal
(4CH, axis, and
situs)
RVOT:                  Appears normal         Lower Extremities:      Appears normal
LVOT:                  Appears normal

Other:  Parents do not wish to know sex of fetus. Heels and 5th digit
visualized. Nasal bone visualized.
Cervix Uterus Adnexa

Cervix
Length:            3.2  cm.
Normal appearance by transabdominal scan.

Uterus
No abnormality visualized.

Left Ovary
Within normal limits.

Right Ovary
Within normal limits.
Cul De Sac:   No free fluid seen.
Adnexa:       No abnormality visualized.
Impression

Single living intrauterine pregnancy at 19w 0d.
Breech presentation.
Placenta Anterior, above cervical os.
Appropriate fetal growth.
Normal amniotic fluid volume.
The fetal anatomic survey is complete.
Normal fetal anatomy.
No fetal anomalies or soft markers of aneuploidy seen.
The adnexa appear normal bilaterally without masses.
The cervix measures 3.2cm on transabdominal imaging
without funneling.
Recommendations

Given hx of IUGR, I recommend follow up ultrasound in 6
weeks for growth.
Management and follow up in the interim as clinically
indicated.
ASA 81mg po qd until 37 weeks given hx IUGR/GHTN.

## 2019-08-04 IMAGING — US US MFM OB FOLLOW-UP
1 series · 14 of 28 positions shown · non-contrast
Comparison: none

[Series 1: us mfm ob follow-up · 53 acquisitions, 14 frames shown]
[im 2/53]
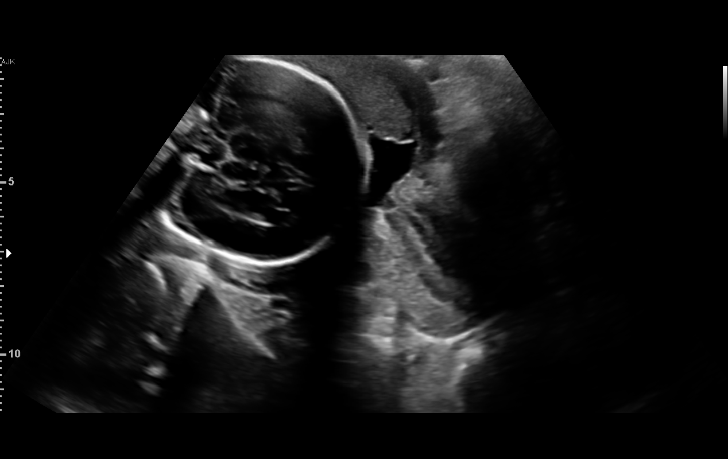
[im 6/53]
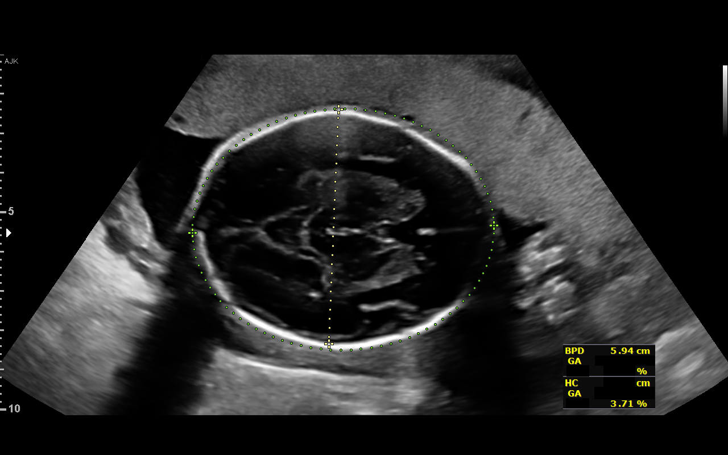
[im 10/53]
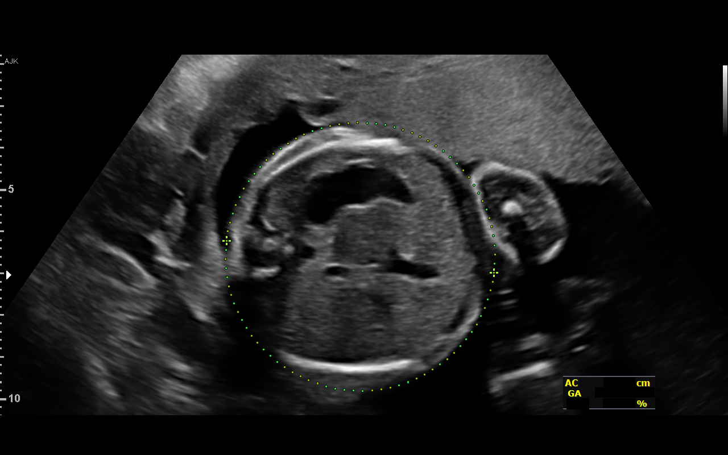
[im 14/53]
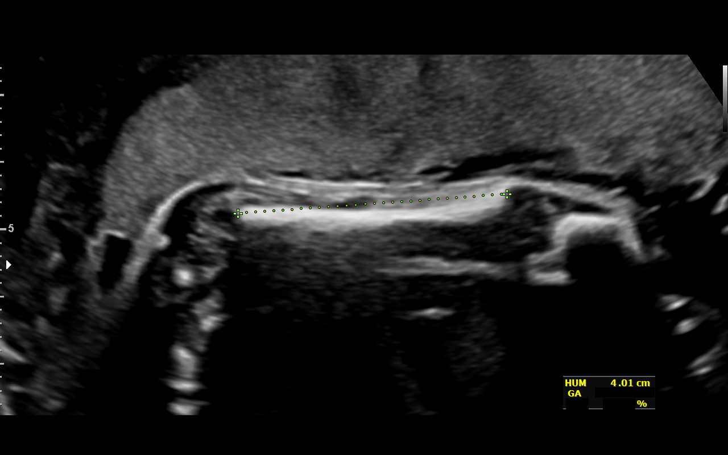
[im 18/53]
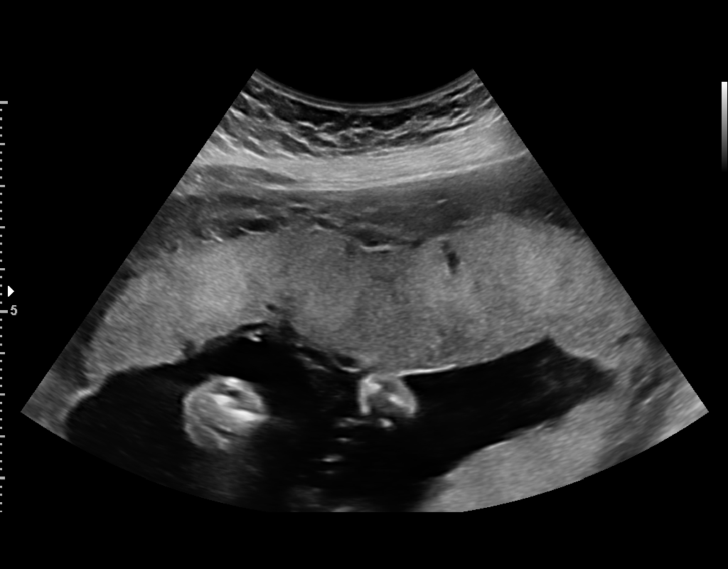
[im 22/53]
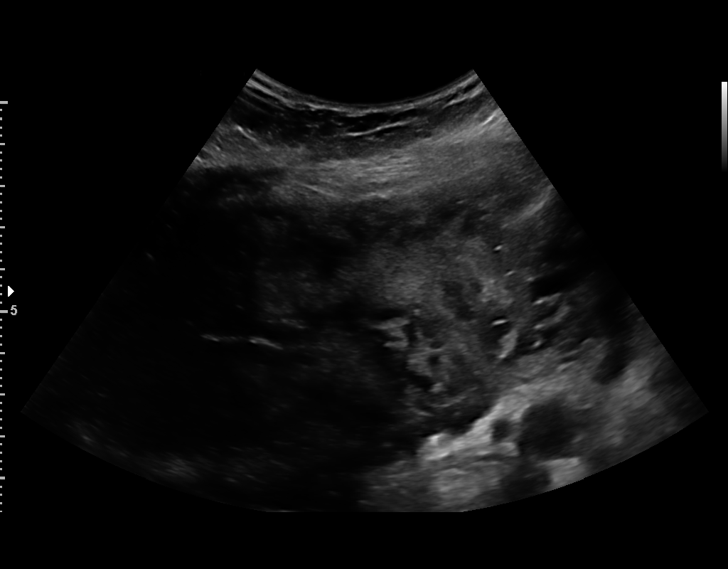
[im 26/53]
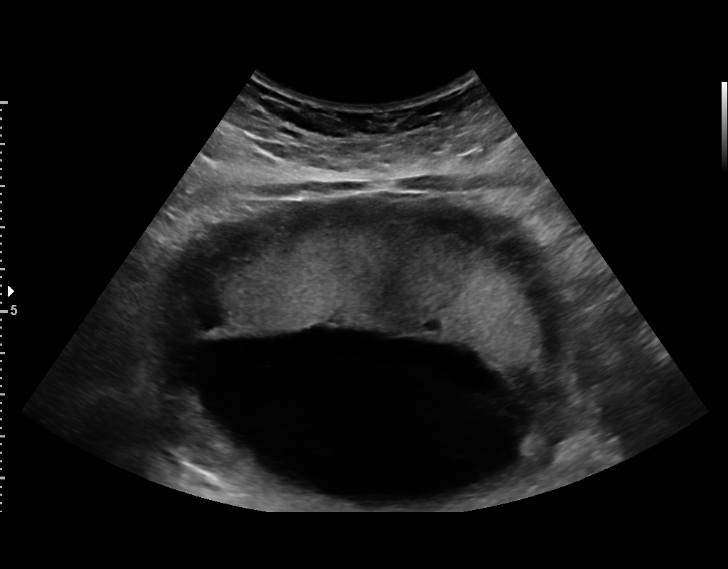
[im 29/53]
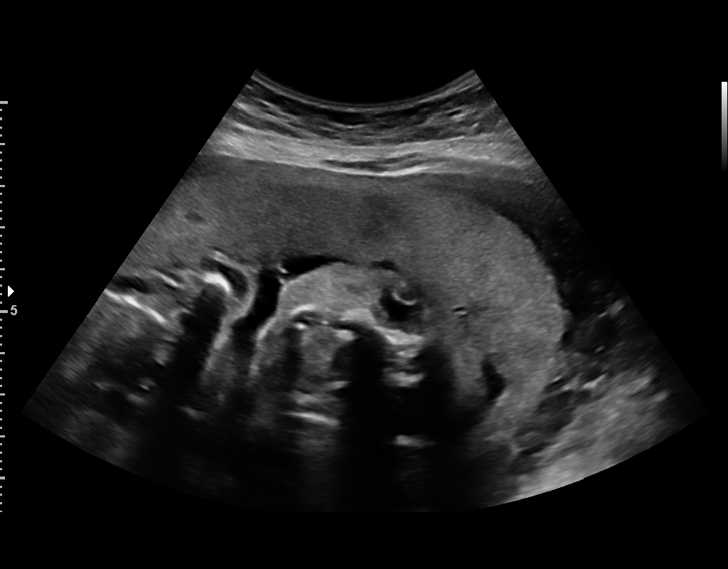
[im 33/53]
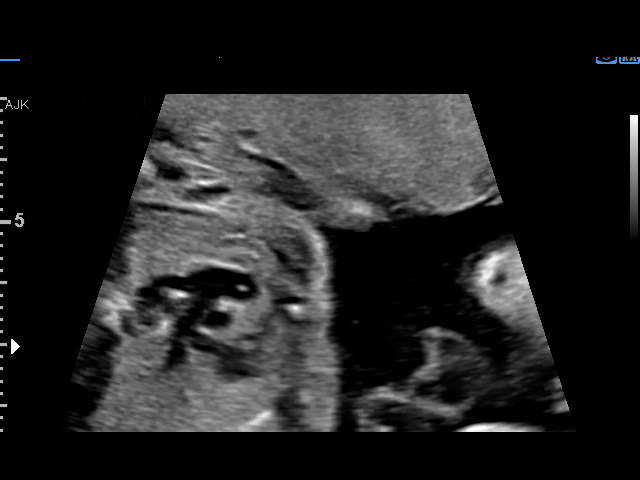
[im 37/53]
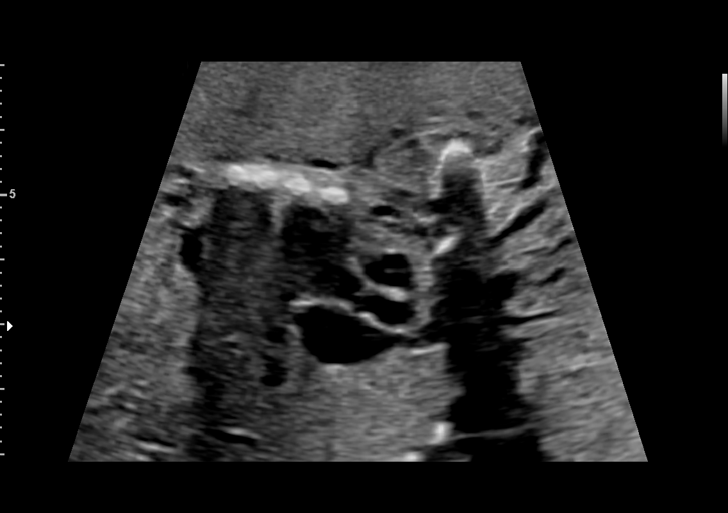
[im 41/53]
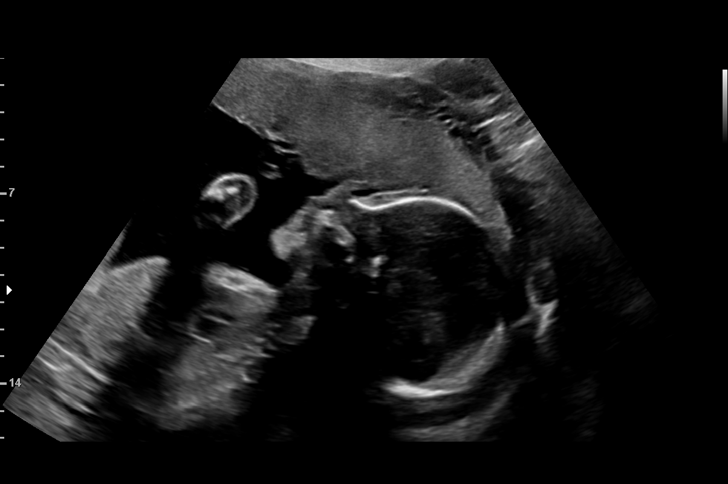
[im 45/53]
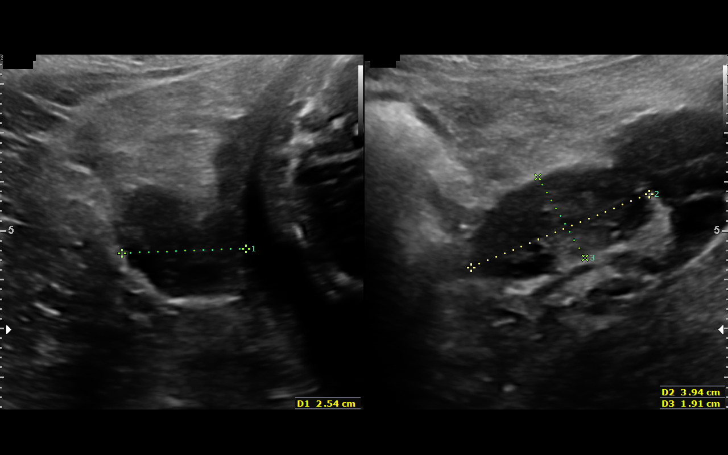
[im 49/53]
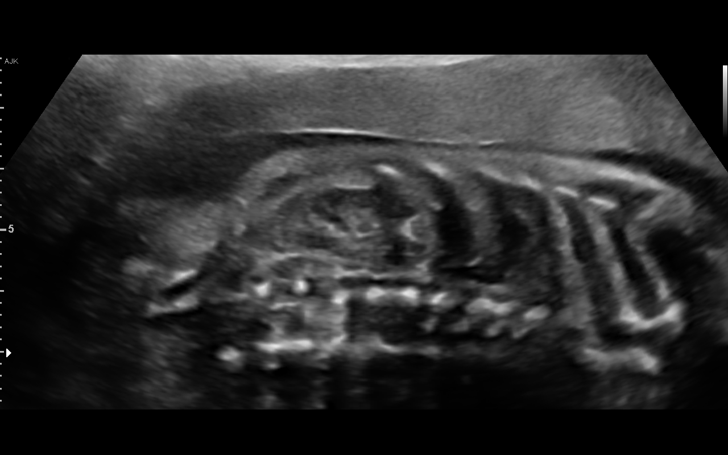
[im 53/53]
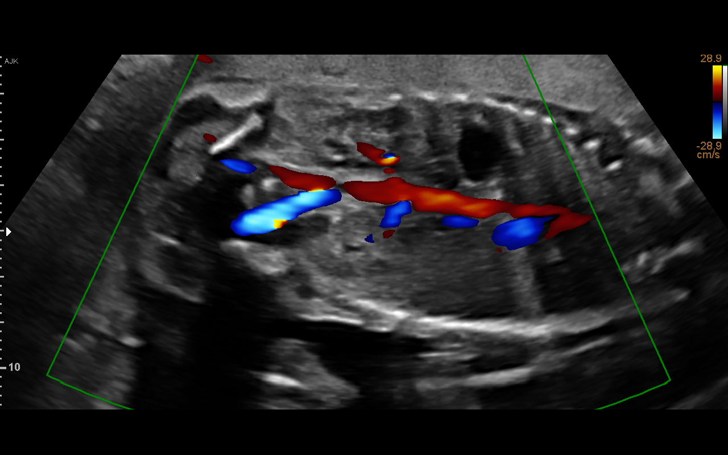

[14 of 28 positions shown; findings below may reference images not displayed]

OB/Gyn Clinic

Indications

25 weeks gestation of pregnancy
Encounter for other antenatal screening
follow-up
Poor obstetric history: Previous fetal growth
restriction (FGR/GHTN) 6lb0oz
Poor obstetric history: Previous
preeclampsia / eclampsia/gestational HTN
OB History

Gravidity:    2         Term:   1        Prem:   0        SAB:   0
TOP:          0       Ectopic:  0        Living: 1
Fetal Evaluation

Num Of Fetuses:     1
Fetal Heart         136
Rate(bpm):
Cardiac Activity:   Observed
Presentation:       Cephalic
Placenta:           Anterior
P. Cord Insertion:  Visualized, central

Amniotic Fluid
AFI FV:      Subjectively within normal limits

Largest Pocket(cm)
6.93
Biometry

BPD:      59.3  mm     G. Age:  24w 2d         17  %    CI:         74.9   %    70 - 86
FL/HC:      20.4   %    18.7 -
HC:      217.4  mm     G. Age:  23w 5d          4  %    HC/AC:      1.08        1.04 -
AC:       201   mm     G. Age:  24w 5d         33  %    FL/BPD:     74.7   %    71 - 87
FL:       44.3  mm     G. Age:  24w 4d         24  %    FL/AC:      22.0   %    20 - 24
HUM:      39.9  mm     G. Age:  24w 2d         26  %

Est. FW:     709  gm      1 lb 9 oz     43  %
Gestational Age

LMP:           25w 0d        Date:  04/18/17                 EDD:   01/23/18
U/S Today:     24w 2d                                        EDD:   01/28/18
Best:          25w 0d     Det. By:  LMP  (04/18/17)          EDD:   01/23/18
Anatomy

Cranium:               Appears normal         Aortic Arch:            Previously seen
Cavum:                 Appears normal         Ductal Arch:            Previously seen
Ventricles:            Appears normal         Diaphragm:              Appears normal
Choroid Plexus:        Previously seen        Stomach:                Appears normal, left
sided
Cerebellum:            Previously seen        Abdomen:                Appears normal
Posterior Fossa:       Previously seen        Abdominal Wall:         Previously seen
Nuchal Fold:           Previously seen        Cord Vessels:           Previously seen
Face:                  Orbits and profile     Kidneys:                Appear normal
previously seen
Lips:                  Previously seen        Bladder:                Appears normal
Thoracic:              Appears normal         Spine:                  Previously seen
Heart:                 Previously seen        Upper Extremities:      Previously seen
RVOT:                  Appears normal         Lower Extremities:      Previously seen
LVOT:                  Appears normal

Other:  Fetus appears to be a female. Heels and 5th digit previously seen.
Nasal bone visualized.
Cervix Uterus Adnexa

Cervix
Length:           4.06  cm.
Normal appearance by transabdominal scan.

Uterus
No abnormality visualized.

Left Ovary
Within normal limits.

Right Ovary
Within normal limits.

Adnexa:       No abnormality visualized.
Impression

Previous fetal growth restriction.
Amniotic fluid is normal and good fetal activity is seen. Fetal
growth is appropriate for gestational age.
Recommendations

Patient has a follow-up fetal growth scan in 5 weeks.
